# Patient Record
Sex: Male | Born: 1950
Health system: Southern US, Community
[De-identification: ages and names within clinical notes are randomized; demographics above are authoritative.]

## PROBLEM LIST (undated history)

## (undated) DIAGNOSIS — E119 Type 2 diabetes mellitus without complications: Secondary | ICD-10-CM

---

## 2007-05-17 ENCOUNTER — Ambulatory Visit: Payer: Self-pay | Admitting: Internal Medicine

## 2007-05-19 ENCOUNTER — Ambulatory Visit: Payer: Self-pay | Admitting: Internal Medicine

## 2007-05-22 ENCOUNTER — Ambulatory Visit: Payer: Self-pay | Admitting: Internal Medicine

## 2008-05-31 DIAGNOSIS — E7800 Pure hypercholesterolemia, unspecified: Secondary | ICD-10-CM | POA: Insufficient documentation

## 2008-05-31 DIAGNOSIS — E119 Type 2 diabetes mellitus without complications: Secondary | ICD-10-CM | POA: Insufficient documentation

## 2008-05-31 DIAGNOSIS — I10 Essential (primary) hypertension: Secondary | ICD-10-CM | POA: Insufficient documentation

## 2009-07-11 DIAGNOSIS — B360 Pityriasis versicolor: Secondary | ICD-10-CM | POA: Insufficient documentation

## 2009-11-08 ENCOUNTER — Emergency Department: Payer: Self-pay | Admitting: Emergency Medicine

## 2010-04-29 DIAGNOSIS — L280 Lichen simplex chronicus: Secondary | ICD-10-CM | POA: Insufficient documentation

## 2010-06-07 DIAGNOSIS — F32A Depression, unspecified: Secondary | ICD-10-CM | POA: Insufficient documentation

## 2010-06-07 DIAGNOSIS — F329 Major depressive disorder, single episode, unspecified: Secondary | ICD-10-CM | POA: Insufficient documentation

## 2010-06-07 DIAGNOSIS — M545 Low back pain: Secondary | ICD-10-CM | POA: Insufficient documentation

## 2010-06-19 DIAGNOSIS — L723 Sebaceous cyst: Secondary | ICD-10-CM | POA: Insufficient documentation

## 2012-08-24 DIAGNOSIS — I2510 Atherosclerotic heart disease of native coronary artery without angina pectoris: Secondary | ICD-10-CM | POA: Insufficient documentation

## 2013-03-23 ENCOUNTER — Emergency Department: Payer: Self-pay | Admitting: Emergency Medicine

## 2013-06-04 DIAGNOSIS — M720 Palmar fascial fibromatosis [Dupuytren]: Secondary | ICD-10-CM | POA: Insufficient documentation

## 2015-01-02 ENCOUNTER — Emergency Department
Admission: EM | Admit: 2015-01-02 | Discharge: 2015-01-02 | Disposition: A | Discharge status: Home / Self Care | Payer: Medicare Other | Attending: Emergency Medicine | Admitting: Emergency Medicine

## 2015-01-02 ENCOUNTER — Encounter: Payer: Self-pay | Admitting: *Deleted

## 2015-01-02 DIAGNOSIS — E1165 Type 2 diabetes mellitus with hyperglycemia: Secondary | ICD-10-CM | POA: Diagnosis not present

## 2015-01-02 DIAGNOSIS — K047 Periapical abscess without sinus: Secondary | ICD-10-CM | POA: Diagnosis not present

## 2015-01-02 DIAGNOSIS — R51 Headache: Secondary | ICD-10-CM | POA: Diagnosis present

## 2015-01-02 DIAGNOSIS — I10 Essential (primary) hypertension: Secondary | ICD-10-CM | POA: Insufficient documentation

## 2015-01-02 LAB — BASIC METABOLIC PANEL
ANION GAP: 12 (ref 5–15)
BUN: 12 mg/dL (ref 6–20)
CALCIUM: 9 mg/dL (ref 8.9–10.3)
CO2: 22 mmol/L (ref 22–32)
Chloride: 98 mmol/L — ABNORMAL LOW (ref 101–111)
Creatinine, Ser: 0.92 mg/dL (ref 0.61–1.24)
GLUCOSE: 510 mg/dL — AB (ref 65–99)
POTASSIUM: 4 mmol/L (ref 3.5–5.1)
SODIUM: 132 mmol/L — AB (ref 135–145)

## 2015-01-02 LAB — BLOOD GAS, VENOUS
Acid-base deficit: 2.5 mmol/L — ABNORMAL HIGH (ref 0.0–2.0)
BICARBONATE: 21.7 meq/L (ref 21.0–28.0)
FIO2: 21
PCO2 VEN: 35 mmHg — AB (ref 44.0–60.0)
PH VEN: 7.4 (ref 7.320–7.430)
Patient temperature: 37

## 2015-01-02 LAB — GLUCOSE, CAPILLARY
GLUCOSE-CAPILLARY: 307 mg/dL — AB (ref 65–99)
Glucose-Capillary: 491 mg/dL — ABNORMAL HIGH (ref 65–99)

## 2015-01-02 MED ORDER — METFORMIN HCL 500 MG PO TABS
1000.0000 mg | ORAL_TABLET | Freq: Once | ORAL | Status: AC
  Administered 2015-01-02: 1000 mg via ORAL
  Filled 2015-01-02 (×2): qty 2

## 2015-01-02 MED ORDER — PENICILLIN V POTASSIUM 500 MG PO TABS: 500.0000 mg | ORAL_TABLET | Freq: Four times a day (QID) | ORAL | Status: DC

## 2015-01-02 MED ORDER — HYDROCODONE-ACETAMINOPHEN 5-325 MG PO TABS: 1.0000 | ORAL_TABLET | Freq: Four times a day (QID) | ORAL | Status: DC | PRN

## 2015-01-02 MED ORDER — CLINDAMYCIN HCL 300 MG PO CAPS: 300.0000 mg | ORAL_CAPSULE | Freq: Three times a day (TID) | ORAL | Status: DC

## 2015-01-02 MED ORDER — METFORMIN HCL 1000 MG PO TABS
1000.0000 mg | ORAL_TABLET | Freq: Two times a day (BID) | ORAL | Status: AC
Start: 2015-01-02 — End: 2018-05-24

## 2015-01-02 MED ORDER — PENICILLIN V POTASSIUM 500 MG PO TABS
500.0000 mg | ORAL_TABLET | Freq: Once | ORAL | Status: AC
  Administered 2015-01-02: 500 mg via ORAL

## 2015-01-02 MED ORDER — INSULIN ASPART 100 UNIT/ML ~~LOC~~ SOLN
4.0000 [IU] | Freq: Once | SUBCUTANEOUS | Status: AC
  Administered 2015-01-02: 4 [IU] via INTRAVENOUS
  Filled 2015-01-02: qty 1
  Filled 2015-01-02: qty 0.04

## 2015-01-02 MED ORDER — HYDROCODONE-ACETAMINOPHEN 5-325 MG PO TABS
1.0000 | ORAL_TABLET | Freq: Once | ORAL | Status: AC
  Administered 2015-01-02: 1 via ORAL
  Filled 2015-01-02: qty 1

## 2015-01-02 MED ORDER — SODIUM CHLORIDE 0.9 % IV BOLUS (SEPSIS)
1000.0000 mL | Freq: Once | INTRAVENOUS | Status: AC
  Administered 2015-01-02: 1000 mL via INTRAVENOUS

## 2015-01-02 MED ORDER — PENICILLIN V POTASSIUM 500 MG PO TABS
ORAL_TABLET | ORAL | Status: AC
  Administered 2015-01-02: 500 mg via ORAL
  Filled 2015-01-02: qty 1

## 2015-01-02 NOTE — Discharge Instructions (Signed)
As we discussed, and is very important that she follow up with a dentist as well as a primary care provider. Your diabetes has been out of control and needs regular and routine treatment. In addition, if you have severe worsening of her headache, develop a fever, develop numbness or weakness, difficulty walking, trouble speaking, or other new concerns arise return to emergency room right away.    You have been seen in the Emergency Department (ED) today for dental pain.  Please take your prescribed antibiotic.  You may take pain medication as needed but ONLY as prescribed.  You should also take over-the-counter pain medication such as ibuprofen according to the label instructions unless a doctor has previously told you to avoid this type of medication (due to stomach ulcers, for example).  Please see you dentist as soon as possible; only a dentist will be able to fix your problem(s).  Please see below for dental follow up options.  Return to the ED if you develop worsening pain, fever, pus/drainage, difficulty breathing, or other symptoms that concern you.  OPTIONS FOR DENTAL FOLLOW UP CARE  [place] [facility] - Local Safety Net Dental Clinics [email or web address]   [facility] [phone])  [facility] [phone])  [place] [phone])  [facility] [phone])  [facility] [phone]) This clinic caters to the indigent population and is on a lottery system. Location: [employer] [facility], [employer], [address], [place] Clinic Hours: Wednesdays from 6pm - 9pm, patients seen by a lottery system. For dates, call or go to [email or web address] Services: Cleanings, fillings and simple extractions. Payment Options: DENTAL WORK IS FREE OF CHARGE. Bring proof of income or support. Best way to get  seen: Arrive at 5:15 pm - this is a lottery, NOT first come/first serve, so arriving earlier will not increase your chances of being seen.     [facility] [phone] Select option 1 for emergencies   Location: [facility], [place], [address] [place] Clinic Hours: No walk-ins accepted - call the day before to schedule an appointment. Check in times are 9:30 am and 1:30 pm. Services: Simple extractions, temporary fillings, pulpectomy/pulp debridement, uncomplicated abscess drainage. Payment Options: PAYMENT IS DUE AT THE TIME OF SERVICE.  Fee is usually $100-200, additional surgical procedures (e.g. abscess drainage) may be extra. Cash, checks, Visa/MasterCard accepted.  Can file Medicaid if patient is covered for dental - patient should call case worker to check. No discount for [facility] patients. Best way to get seen: MUST call the day before and get onto the schedule. Can usually be seen the next 1-2 days. No walk-ins accepted.     [facility] [phone]   Location: [facility], [address] [place] Clinic Hours: M, W, Th, F 8am or 1:30pm, Tues 9a or 1:30 - first come/first served. Services: Simple extractions, temporary fillings, uncomplicated abscess drainage.  You do not need to be an [facility] resident. Payment Options: PAYMENT IS DUE AT THE TIME OF SERVICE. Dental insurance, otherwise sliding scale - bring proof of income or support. Depending on income and treatment needed, cost is usually $50-200. Best way to get seen: Arrive early as it is first come/first served.     [facility] [phone]   Location: [address] Clinic Hours: Mon-Thu 8a-5p Services: Most basic dental services including extractions and  fillings. Payment Options: PAYMENT IS DUE AT THE TIME OF SERVICE. Sliding scale, up to 50% off -  bring proof if income or support. Medicaid with dental option accepted. Best way to get seen: Call to schedule an appointment, can usually be seen within 2 weeks OR they will try to see walk-ins - show up at 8a or 2p (you may have to wait).     [facility] [phone] [place] RESIDENTS ONLY   Location: [facility], [address], [place], [place] [id number] Clinic Hours: By appointment only. Monday - Thursday 8am-5pm, Friday 8am-12pm Services: Cleanings, fillings, extractions. Payment Options: PAYMENT IS DUE AT THE TIME OF SERVICE. Cash, Visa or MasterCard. Sliding scale - $30 minimum per service. Best way to get seen: Come in to office, complete packet and make an appointment - need proof of income or support monies for each household member and proof of [place] residence. Usually takes about a month to get in.     [facility] [phone]   Location: [address] [facility] Hours: Walk-in Urgent Care Dental Services are offered Monday-Friday mornings only. The numbers of emergencies accepted daily is limited to the number of providers available. Maximum 15 - Mondays, Wednesdays & Thursdays Maximum 10 - Tuesdays & Fridays Services: You do not need to be a [place] resident to be seen for a dental emergency. Emergencies are defined as pain, swelling, abnormal bleeding, or dental trauma. Walkins will receive x-rays if needed. NOTE: Dental cleaning is not an emergency. Payment Options: PAYMENT IS DUE AT THE TIME OF SERVICE. Minimum co-pay is $40.00 for uninsured patients. Minimum co-pay is $3.00 for Medicaid with dental coverage. Dental Insurance is accepted and must be presented at time of visit. Medicare does not cover dental. Forms of payment: Cash, credit card, checks. Best way to get seen: If not previously registered with the clinic, walk-in dental registration begins at 7:15  am and is on a first come/first serve basis. If previously registered with the clinic, call to make an appointment.     [facility] [phone] [place] RESIDENTS ONLY   Location: [address], [place], [place] Clinic Hours: Mon-Thu 10a-2p Services: Extractions only! Payment Options: FREE (donations accepted) - bring proof of income or support Best way to get seen: Call and schedule an appointment OR come at 8am on the 1st Monday of every month (except for holidays) when it is first come/first served.     [facility] [phone]   Location: [address], [place] Clinic Hours: Friday mornings Services, Payment Options, Best way to get seen: Call for info      Dental Abscess A dental abscess is a collection of pus in or around a tooth. CAUSES This condition is caused by a bacterial infection around the root of the tooth that involves the inner part of the tooth (pulp). It may result from:  Severe tooth decay.  Trauma to the tooth that allows bacteria to enter into the pulp, such as a broken or chipped tooth.  Severe gum disease around a tooth. SYMPTOMS Symptoms of this condition include:  Severe pain in and around the infected tooth.  Swelling and redness around the infected tooth, in the mouth, or in the face.  Tenderness.  Pus drainage.  Bad breath.  Bitter taste in the mouth.  Difficulty swallowing.  Difficulty opening the mouth.  Nausea.  Vomiting.  Chills.  Swollen neck glands.  Fever.  DIAGNOSIS This condition is diagnosed with examination of the infected tooth. During the exam, your dentist may tap on the infected tooth. Your dentist will also ask about your medical and dental history and may order X-rays. TREATMENT This condition is treated by eliminating the infection. This may be done with:  Antibiotic medicine.  A root canal. This may be performed to save the tooth.  Pulling (extracting) the tooth. This  may also involve draining the abscess. This is done if the tooth cannot be saved. HOME CARE INSTRUCTIONS  Take medicines only as directed by your dentist.  If you were prescribed antibiotic medicine, finish all of it even if you start to feel better.  Rinse your mouth (gargle) often with salt water to relieve pain or swelling.  Do not drive or operate heavy machinery while taking pain medicine.  Do not apply heat to the outside of your mouth.  Keep all follow-up visits as directed by your dentist. This is important. SEEK MEDICAL CARE IF:  Your pain is worse and is not helped by medicine. SEEK IMMEDIATE MEDICAL CARE IF:  You have a fever or chills.  Your symptoms suddenly get worse.  You have a very bad headache.  You have problems breathing or swallowing.  You have trouble opening your mouth.  You have swelling in your neck or around your eye.   This information is not intended to replace advice given to you by your health care provider. Make sure you discuss any questions you have with your health care provider.   Document Released: 02/22/2005 Document Revised: 07/09/2014 Document Reviewed: 02/19/2014 Elsevier Interactive Patient Education 2016 Elsevier Inc.  Type 2 Diabetes Mellitus, Adult Type 2 diabetes mellitus, often simply referred to as type 2 diabetes, is a long-lasting (chronic) disease. In type 2 diabetes, the pancreas does not make enough insulin (a hormone), the cells are less responsive to the insulin that is made (insulin resistance), or both. Normally, insulin moves sugars from food into the tissue cells. The tissue cells use the sugars for energy. The lack of insulin or the lack of normal response to insulin causes excess sugars to build up in the blood instead of going into the tissue cells. As a result, high blood sugar (hyperglycemia) develops. The effect of high sugar (glucose) levels can cause many complications. Type 2 diabetes was also previously called  adult-onset diabetes, but it can occur at any age.  RISK FACTORS  A person is predisposed to developing type 2 diabetes if someone in the family has the disease and also has one or more of the following primary risk factors:  Weight gain, or being overweight or obese.  An inactive lifestyle.  A history of consistently eating high-calorie foods. Maintaining a normal weight and regular physical activity can reduce the chance of developing type 2 diabetes. SYMPTOMS  A person with type 2 diabetes may not show symptoms initially. The symptoms of type 2 diabetes appear slowly. The symptoms include:  Increased thirst (polydipsia).  Increased urination (polyuria).  Increased urination during the night (nocturia).  Sudden or unexplained weight changes.  Frequent, recurring infections.  Tiredness (fatigue).  Weakness.  Vision changes, such as blurred vision.  Fruity smell to your breath.  Abdominal pain.  Nausea or vomiting.  Cuts or bruises which are slow to heal.  Tingling or numbness in the hands or feet.  An open skin wound (ulcer). DIAGNOSIS Type 2 diabetes is frequently not diagnosed until complications of diabetes are present. Type 2 diabetes  is diagnosed when symptoms or complications are present and when blood glucose levels are increased. Your blood glucose level may be checked by one or more of the following blood tests:  A fasting blood glucose test. You will not be allowed to eat for at least 8 hours before a blood sample is taken.  A random blood glucose test. Your blood glucose is checked at any time of the day regardless of when you ate.  A hemoglobin A1c blood glucose test. A hemoglobin A1c test provides information about blood glucose control over the previous 3 months.  An oral glucose tolerance test (OGTT). Your blood glucose is measured after you have not eaten (fasted) for 2 hours and then after you drink a glucose-containing beverage. TREATMENT   You  may need to take insulin or diabetes medicine daily to keep blood glucose levels in the desired range.  If you use insulin, you may need to adjust the dosage depending on the carbohydrates that you eat with each meal or snack.  Lifestyle changes are recommended as part of your treatment. These may include:  Following an individualized diet plan developed by a nutritionist or dietitian.  Exercising daily. Your health care providers will set individualized treatment goals for you based on your age, your medicines, how long you have had diabetes, and any other medical conditions you have. Generally, the goal of treatment is to maintain the following blood glucose levels:  Before meals (preprandial): 80-130 mg/dL.  After meals (postprandial): below 180 mg/dL.  A1c: less than 6.5-7%. HOME CARE INSTRUCTIONS   Have your hemoglobin A1c level checked twice a year.  Perform daily blood glucose monitoring as directed by your health care provider.  Monitor urine ketones when you are ill and as directed by your health care provider.  Take your diabetes medicine or insulin as directed by your health care provider to maintain your blood glucose levels in the desired range.  Never run out of diabetes medicine or insulin. It is needed every day.  If you are using insulin, you may need to adjust the amount of insulin given based on your intake of carbohydrates. Carbohydrates can raise blood glucose levels but need to be included in your diet. Carbohydrates provide vitamins, minerals, and fiber which are an essential part of a healthy diet. Carbohydrates are found in fruits, vegetables, whole grains, dairy products, legumes, and foods containing added sugars.  Eat healthy foods. You should make an appointment to see a registered dietitian to help you create an eating plan that is right for you.  Lose weight if you are overweight.  Carry a medical alert card or wear your medical alert jewelry.  Carry  a 15-gram carbohydrate snack with you at all times to treat low blood glucose (hypoglycemia). Some examples of 15-gram carbohydrate snacks include:  Glucose tablets, 3 or 4.  Glucose gel, 15-gram tube.  Raisins, 2 tablespoons (24 grams).  Jelly beans, 6.  Animal crackers, 8.  Regular pop, 4 ounces (120 mL).  Gummy treats, 9.  Recognize hypoglycemia. Hypoglycemia occurs with blood glucose levels of 70 mg/dL and below. The risk for hypoglycemia increases when fasting or skipping meals, during or after intense exercise, and during sleep. Hypoglycemia symptoms can include:  Tremors or shakes.  Decreased ability to concentrate.  Sweating.  Increased heart rate.  Headache.  Dry mouth.  Hunger.  Irritability.  Anxiety.  Restless sleep.  Altered speech or coordination.  Confusion.  Treat hypoglycemia promptly. If you are alert and able  to safely swallow, follow the 15:15 rule:  Take 15-20 grams of rapid-acting glucose or carbohydrate. Rapid-acting options include glucose gel, glucose tablets, or 4 ounces (120 mL) of fruit juice, regular soda, or low-fat milk.  Check your blood glucose level 15 minutes after taking the glucose.  Take 15-20 grams more of glucose if the repeat blood glucose level is still 70 mg/dL or below.  Eat a meal or snack within 1 hour once blood glucose levels return to normal.  Be alert to feeling very thirsty and urinating more frequently than usual, which are early signs of hyperglycemia. An early awareness of hyperglycemia allows for prompt treatment. Treat hyperglycemia as directed by your health care provider.  Engage in at least 150 minutes of moderate-intensity physical activity a week, spread over at least 3 days of the week or as directed by your health care provider. In addition, you should engage in resistance exercise at least 2 times a week or as directed by your health care provider. Try to spend no more than 90 minutes at one time  inactive.  Adjust your medicine and food intake as needed if you start a new exercise or sport.  Follow your sick-day plan anytime you are unable to eat or drink as usual.  Do not use any tobacco products including cigarettes, chewing tobacco, or electronic cigarettes. If you need help quitting, ask your health care provider.  Limit alcohol intake to no more than 1 drink per day for nonpregnant women and 2 drinks per day for men. You should drink alcohol only when you are also eating food. Talk with your health care provider whether alcohol is safe for you. Tell your health care provider if you drink alcohol several times a week.  Keep all follow-up visits as directed by your health care provider. This is important.  Schedule an eye exam soon after the diagnosis of type 2 diabetes and then annually.  Perform daily skin and foot care. Examine your skin and feet daily for cuts, bruises, redness, nail problems, bleeding, blisters, or sores. A foot exam by a health care provider should be done annually.  Brush your teeth and gums at least twice a day and floss at least once a day. Follow up with your dentist regularly.  Share your diabetes management plan with your workplace or school.  Keep your immunizations up to date. It is recommended that you receive a flu (influenza) vaccine every year. It is also recommended that you receive a pneumonia (pneumococcal) vaccine. If you are 64 years of age or older and have never received a pneumonia vaccine, this vaccine may be given as a series of two separate shots. Ask your health care provider which additional vaccines may be recommended.  Learn to manage stress.  Obtain ongoing diabetes education and support as needed.  Participate in or seek rehabilitation as needed to maintain or improve independence and quality of life. Request a physical or occupational therapy referral if you are having foot or hand numbness, or difficulties with grooming,  dressing, eating, or physical activity. SEEK MEDICAL CARE IF:   You are unable to eat food or drink fluids for more than 6 hours.  You have nausea and vomiting for more than 6 hours.  Your blood glucose level is over 240 mg/dL.  There is a change in mental status.  You develop an additional serious illness.  You have diarrhea for more than 6 hours.  You have been sick or have had a fever for  a couple of days and are not getting better.  You have pain during any physical activity.  SEEK IMMEDIATE MEDICAL CARE IF:  You have difficulty breathing.  You have moderate to large ketone levels.   This information is not intended to replace advice given to you by your health care provider. Make sure you discuss any questions you have with your health care provider.   Document Released: 02/22/2005 Document Revised: 11/13/2014 Document Reviewed: 09/21/2011 Elsevier Interactive Patient Education [employer]

## 2015-01-02 NOTE — ED Provider Notes (Signed)
[facility] Emergency Department Provider Note REMINDER - THIS NOTE IS NOT A FINAL MEDICAL RECORD UNTIL IT IS SIGNED. UNTIL THEN, THE CONTENT BELOW MAY REFLECT INFORMATION FROM A DOCUMENTATION TEMPLATE, NOT THE ACTUAL PATIENT VISIT. ____________________________________________  Time seen: Approximately 6:10 PM  I have reviewed the triage vital signs and the nursing notes.   HISTORY  Chief Complaint Headache    HPI Shawn Wagner is a 64 y.o. male history diabetes, hypertension. Also noncompliant with medications due to expenses.  He presents today for about 1 week of pain in the right upper jaw/mouth, states that he has infected tooth and it is shooting pain and occasional tingling up into the area below the right eye from the region where he points at the right maxilla.  This is not associated with any fever, numbness, weakness. He does have a mild right-sided headache which she says is shooting up from the upper teeth. No nausea or vomiting. Does report he is supposed be on metformin, but he has been off of this due to expenses.   Past medical history Patient states he is a diabetic Also states he has high blood pressure   He denies any known allergies. No past medical history on file.  There are no active problems to display for this patient.   No past surgical history on file.  Current Outpatient Rx  Name  Route  Sig  Dispense  Refill  . clindamycin (CLEOCIN) 300 MG capsule   Oral   Take 1 capsule (300 mg total) by mouth 3 (three) times daily.   30 capsule   0   . HYDROcodone-acetaminophen (NORCO/VICODIN) 5-325 MG tablet   Oral   Take 1 tablet by mouth every 6 (six) hours as needed for moderate pain.   15 tablet   0   . metFORMIN (GLUCOPHAGE) 1000 MG tablet   Oral   Take 1 tablet (1,000 mg total) by mouth 2 (two) times daily with a meal.   60 tablet   0   . penicillin v potassium (VEETID) 500 MG tablet   Oral   Take 1 tablet (500  mg total) by mouth 4 (four) times daily.   28 tablet   0     Allergies Review of patient's allergies indicates no known allergies.  No family history on file.  Social History Social History  Substance Use Topics  . Smoking status: Never Smoker   . Smokeless tobacco: Not on file  . Alcohol Use: No    Review of Systems Constitutional: No fever/chills Eyes: No visual changes. No trouble seeing. Denies eye pain. No pain over the temples. ENT: No sore throat. They will open and close his jaw no problem. Able eat without problem. Cardiovascular: Denies chest pain. Respiratory: Denies shortness of breath. Gastrointestinal: No abdominal pain.  No nausea, no vomiting.  No diarrhea.  No constipation. Genitourinary: Negative for dysuria. Musculoskeletal: Negative for back pain. Skin: Negative for rash. Neurological: Negative for  focal weakness or numbness. No trouble speaking. No trouble walking.  10-point ROS otherwise negative.  ____________________________________________   PHYSICAL EXAM:  VITAL SIGNS: ED Triage Vitals  Enc Vitals Group     BP 01/02/15 1719 119/77 mmHg     Pulse Rate 01/02/15 1716 97     Resp 01/02/15 1716 20     Temp 01/02/15 1716 98 F (36.7 C)     Temp Source 01/02/15 1716 Oral     SpO2 01/02/15 1716 98 %  Weight 01/02/15 1716 170 lb (77.111 kg)     Height 01/02/15 1716 6\' 1"  (1.854 m)     Head Cir --      Peak Flow --      Pain Score 01/02/15 1718 8     Pain Loc --      Pain Edu? --      Excl. in [place]? --    Constitutional: Alert and oriented. Well appearing and in no acute distress. Patient is quite amicable and in no distress. Eyes: Conjunctivae are normal. PERRL. EOMI. Head: Atraumatic. No temporal artery tenderness. Normal temporal artery pulsations bilaterally. Nose: No congestion/rhinnorhea. Mouth/Throat: Mucous membranes are moist.  The patient has a right upper incisor is very tender, as an obvious cavity, and is associated with a  small draining fistula in the right lateral gumline. There is minimal edema over the right maxilla, and slight tenderness of the right maxilla. Oropharynx non-erythematous. No lingular edema. No trismus. Very clear and patent airway. Neck: No stridor.   Cardiovascular: Normal rate, regular rhythm. Grossly normal heart sounds.  Good peripheral circulation. Respiratory: Normal respiratory effort.  No retractions. Lungs CTAB. Gastrointestinal: Soft and nontender. No distention. No abdominal bruits. No CVA tenderness. Musculoskeletal: No lower extremity tenderness nor edema.  No joint effusions. Neurologic:  Normal speech and language. No gross focal neurologic deficits are appreciated. No gait instability.  The patient has no pronator drift. The patient has normal cranial nerve exam. Extraocular movements are normal. Visual fields are normal. Patient has 5 out of 5 strength in all extremities. There is no numbness or gross, acute sensory abnormality in the extremities bilaterally. No speech disturbance. No dysarthria. No aphasia. No ataxia. Patient speaking in full and clear sentences.   Skin:  Skin is warm, dry and intact. No rash noted. Psychiatric: Mood and affect are normal. Speech and behavior are normal.  ____________________________________________   LABS (all labs ordered are listed, but only abnormal results are displayed)  Labs Reviewed  BASIC METABOLIC PANEL - Abnormal; Notable for the following:    Sodium 132 (*)    Chloride 98 (*)    Glucose, Bld 510 (*)    All other components within normal limits  BLOOD GAS, VENOUS - Abnormal; Notable for the following:    pCO2, Ven 35 (*)    Acid-base deficit 2.5 (*)    All other components within normal limits  GLUCOSE, CAPILLARY - Abnormal; Notable for the following:    Glucose-Capillary 491 (*)    All other components within normal limits  GLUCOSE, CAPILLARY - Abnormal; Notable for the following:    Glucose-Capillary 307 (*)     All other components within normal limits  CBG MONITORING, ED   ____________________________________________  EKG   ____________________________________________  RADIOLOGY   ____________________________________________   PROCEDURES  Procedure(s) performed: None  Critical Care performed: No  ____________________________________________   INITIAL IMPRESSION / ASSESSMENT AND PLAN / ED COURSE  Pertinent labs & imaging results that were available during my care of the patient were reviewed by me and considered in my medical decision making (see chart for details).  She presents with a right-sided facial pain and headache. Appears be most likely secondary to an infected tooth in the right upper maxilla. We did discuss obtaining a CAT scan, the patient does not wish to have this. Please see my note below.  I will plan to treat the patient with antibiotics, I'll give him prescriptions for clindamycin and penicillin and he will take both to  the pharmacy and review cost of each, but minimal at least start the penicillin though I did discuss than the clindamycin is more efficacious. In addition, give him a brief course of pain medication. He'll follow up closely with primary care and dental.  Check patient's sugar which is greater than 400, we will obtain basic metabolic And give insulin/fluids. Anticipate discharge with metformin if no acute metabolic derangements. ____________________________________________   FINAL CLINICAL IMPRESSION(S) / ED DIAGNOSES  Final diagnoses:  Dental abscess  Type 2 diabetes mellitus with hyperglycemia, without long-term current use of insulin (HCC)        ----------------------------------------- 6:08 PM on 01/02/2015 -----------------------------------------  I discussed with the patient his symptoms, and clinically I agree likely his headache and facial pain as being referred up from the dental abscess with a noted fistula of the right  upper maxilla and some slight swelling. We did discuss the risks and benefits of CAT scan to exclude diagnosis such as bleeding, tumor, or less likely other cause like stroke. After discussion of the risks and benefits, patient voiced significant financial concerns and, though I did offer it I think it is not unreasonable to forego CAT scan with the understanding that if symptoms worsen, he develops a fever, he has a numbness tingling weakness nausea or severe worsening headache, vision changes or other concerns to come back right away for a CAT scan.  In addition, the patient is a diabetic, but has not been on his metformin or insulin for some time. I will check a glucose, if it is greater than 350 then we will consider doing blood work. The patient is quite clear that his goal today is to get his dental pain treated which seems to be causing his headache, in addition he does not wish to incur significant cost of care today. I'm willing to work with this constraints given the patient has good return precautions and is agreeable to establish primary care. I plan to place him back on his metformin 1000 twice daily which is previously been on, and have strongly encourage the need for primary care follow-up for which she is agreeable.  ----------------------------------------- 7:51 PM on 01/02/2015 -----------------------------------------  Patient reports improvement. Currently in no distress. Discussed the patient and will provide him prescriptions for 2 antibiotics, he will fill that which he prefers based on cost. I did encourage the clindamycin may have better effect, but he is extremely concerned and cost conscious. I have discussed financial counseling, but he will have to return tomorrow for this. We'll also give him referrals for local dental clinics and multiple primary care providers in the area. Patient agreeable careful return precautions and follow-up care.  Sharyn Creamer, MD 01/02/15 [phone]

## 2015-01-02 NOTE — ED Notes (Signed)
Pt has a headache for 4 days.  Taking goody powders without pain relief.  Pt alert.  Speech clear.

## 2015-01-02 NOTE — ED Notes (Signed)
Also says he has a swollen numb on his gums on the right side as well.  Has had for 2 months, but has not been to dentist.

## 2015-01-07 ENCOUNTER — Emergency Department: Payer: Medicare Other

## 2015-01-07 ENCOUNTER — Encounter: Payer: Self-pay | Admitting: Emergency Medicine

## 2015-01-07 ENCOUNTER — Emergency Department
Admission: EM | Admit: 2015-01-07 | Discharge: 2015-01-07 | Disposition: A | Discharge status: Home / Self Care | Payer: Medicare Other | Attending: Emergency Medicine | Admitting: Emergency Medicine

## 2015-01-07 DIAGNOSIS — R51 Headache: Secondary | ICD-10-CM | POA: Diagnosis present

## 2015-01-07 DIAGNOSIS — Z792 Long term (current) use of antibiotics: Secondary | ICD-10-CM | POA: Insufficient documentation

## 2015-01-07 DIAGNOSIS — Z79899 Other long term (current) drug therapy: Secondary | ICD-10-CM | POA: Insufficient documentation

## 2015-01-07 DIAGNOSIS — G930 Cerebral cysts: Secondary | ICD-10-CM | POA: Insufficient documentation

## 2015-01-07 DIAGNOSIS — K047 Periapical abscess without sinus: Secondary | ICD-10-CM | POA: Diagnosis not present

## 2015-01-07 DIAGNOSIS — E1165 Type 2 diabetes mellitus with hyperglycemia: Secondary | ICD-10-CM | POA: Diagnosis not present

## 2015-01-07 DIAGNOSIS — R739 Hyperglycemia, unspecified: Secondary | ICD-10-CM

## 2015-01-07 DIAGNOSIS — R519 Headache, unspecified: Secondary | ICD-10-CM

## 2015-01-07 HISTORY — DX: Type 2 diabetes mellitus without complications: E11.9

## 2015-01-07 LAB — BASIC METABOLIC PANEL
ANION GAP: 13 (ref 5–15)
BUN: 20 mg/dL (ref 6–20)
CHLORIDE: 93 mmol/L — AB (ref 101–111)
CO2: 23 mmol/L (ref 22–32)
CREATININE: 1.1 mg/dL (ref 0.61–1.24)
Calcium: 9.2 mg/dL (ref 8.9–10.3)
GFR calc non Af Amer: >60 mL/min (ref >60)
Glucose, Bld: 487 mg/dL — ABNORMAL HIGH (ref 65–99)
POTASSIUM: 4.1 mmol/L (ref 3.5–5.1)
SODIUM: 129 mmol/L — AB (ref 135–145)

## 2015-01-07 LAB — CBC WITH DIFFERENTIAL/PLATELET
BASOS ABS: 0.1 10*3/uL (ref 0–0.1)
EOS ABS: 0.1 10*3/uL (ref 0–0.7)
Eosinophils Relative: 2 %
HEMATOCRIT: 47.2 % (ref 40.0–52.0)
HEMOGLOBIN: 16.9 g/dL (ref 13.0–18.0)
Lymphocytes Relative: 34 %
Lymphs Abs: 2.2 10*3/uL (ref 1.0–3.6)
MCH: 31.1 pg (ref 26.0–34.0)
MCHC: 35.9 g/dL (ref 32.0–36.0)
MCV: 86.7 fL (ref 80.0–100.0)
Monocytes Absolute: 0.8 10*3/uL (ref 0.2–1.0)
NEUTROS ABS: 3.3 10*3/uL (ref 1.4–6.5)
Platelets: 219 10*3/uL (ref 150–440)
RBC: 5.44 MIL/uL (ref 4.40–5.90)
RDW: 12.3 % (ref 11.5–14.5)
WBC: 6.5 10*3/uL (ref 3.8–10.6)

## 2015-01-07 LAB — GLUCOSE, CAPILLARY
GLUCOSE-CAPILLARY: 528 mg/dL — AB (ref 65–99)
GLUCOSE-CAPILLARY: 321 mg/dL — AB (ref 65–99)

## 2015-01-07 MED ORDER — METOCLOPRAMIDE HCL 5 MG/ML IJ SOLN
10.0000 mg | Freq: Once | INTRAMUSCULAR | Status: AC
  Administered 2015-01-07: 10 mg via INTRAVENOUS
  Filled 2015-01-07: qty 2

## 2015-01-07 MED ORDER — LORAZEPAM 1 MG PO TABS
1.0000 mg | ORAL_TABLET | Freq: Once | ORAL | Status: AC
  Administered 2015-01-07: 1 mg via ORAL
  Filled 2015-01-07: qty 1

## 2015-01-07 MED ORDER — INSULIN ASPART 100 UNIT/ML ~~LOC~~ SOLN
8.0000 [IU] | Freq: Once | SUBCUTANEOUS | Status: AC
  Administered 2015-01-07: 8 [IU] via SUBCUTANEOUS
  Filled 2015-01-07: qty 8

## 2015-01-07 MED ORDER — GLYBURIDE 5 MG PO TABS: 5.0000 mg | ORAL_TABLET | Freq: Every day | ORAL | Status: DC

## 2015-01-07 MED ORDER — SODIUM CHLORIDE 0.9 % IV BOLUS (SEPSIS)
1000.0000 mL | Freq: Once | INTRAVENOUS | Status: AC
  Administered 2015-01-07: 1000 mL via INTRAVENOUS

## 2015-01-07 MED ORDER — IOHEXOL 300 MG/ML  SOLN
75.0000 mL | Freq: Once | INTRAMUSCULAR | Status: AC | PRN
  Administered 2015-01-07: 75 mL via INTRAVENOUS

## 2015-01-07 MED ORDER — ACETAMINOPHEN 500 MG PO TABS
1000.0000 mg | ORAL_TABLET | Freq: Once | ORAL | Status: AC
  Administered 2015-01-07: 1000 mg via ORAL
  Filled 2015-01-07: qty 2

## 2015-01-07 MED ORDER — METOCLOPRAMIDE HCL 5 MG PO TABS: 5.0000 mg | ORAL_TABLET | Freq: Three times a day (TID) | ORAL | Status: DC

## 2015-01-07 MED ORDER — BLOOD GLUCOSE MONITOR KIT: PACK | Status: AC

## 2015-01-07 NOTE — ED Provider Notes (Signed)
[facility] Emergency Department Provider Note  ____________________________________________  Time seen: 1825  I have reviewed the triage vital signs and the nursing notes.   HISTORY  Chief Complaint Headache and Blurred Vision     HPI Shawn Wagner is a 64 y.o. male who was seen here on October 27 for similar symptoms-A headache in the right temple. At that time, it was noted that he had a dental infection. The patient has reported dentition. He was started on antibiotics. The patient reports that the "pocket of pus" that was present in the right upper jaw has improved, but he has had ongoing pain in the right temple. He does not usually get headaches like this and it is disconcerting to him. He denies any focal tenderness over the temple. He denies any fevers, nausea, or vomiting. He has not had any weakness, but does have reduced activity due to the headache.  The patient was seen here the other day, he did have a high sugar level. He was prescribed metformin and reports he has been taking it, but he does not have a glucometer and does not have her sugars have been running. He has not arranged for a dental appointment yet.    Past Medical History  Diagnosis Date  . Diabetes mellitus without complication ([place])     There are no active problems to display for this patient.   History reviewed. No pertinent past surgical history.  Current Outpatient Rx  Name  Route  Sig  Dispense  Refill  . blood glucose meter kit and supplies KIT      Dispense based on patient and insurance preference. Use up to four times daily as directed. (FOR ICD-9 250.00, 250.01).   1 each   0   . clindamycin (CLEOCIN) 300 MG capsule   Oral   Take 1 capsule (300 mg total) by mouth 3 (three) times daily.   30 capsule   0   . glyBURIDE (DIABETA) 5 MG tablet   Oral   Take 1 tablet (5 mg total) by mouth daily with breakfast.   15 tablet   0   . HYDROcodone-acetaminophen  (NORCO/VICODIN) 5-325 MG tablet   Oral   Take 1 tablet by mouth every 6 (six) hours as needed for moderate pain.   15 tablet   0   . metFORMIN (GLUCOPHAGE) 1000 MG tablet   Oral   Take 1 tablet (1,000 mg total) by mouth 2 (two) times daily with a meal.   60 tablet   0   . metoCLOPramide (REGLAN) 5 MG tablet   Oral   Take 1 tablet (5 mg total) by mouth 3 (three) times daily.   15 tablet   0   . penicillin v potassium (VEETID) 500 MG tablet   Oral   Take 1 tablet (500 mg total) by mouth 4 (four) times daily.   28 tablet   0     Allergies Vicodin  No family history on file.  Social History Social History  Substance Use Topics  . Smoking status: Never Smoker   . Smokeless tobacco: None  . Alcohol Use: No    Review of Systems  Constitutional: Negative for fever. ENT: Headache, right temple, with right upper jaw infection. She history of present illness. Cardiovascular: Negative for chest pain. Respiratory: Negative for cough. Gastrointestinal: Negative for abdominal pain, vomiting and diarrhea. Genitourinary: Negative for dysuria. Musculoskeletal: No myalgias or injuries. Skin: Negative for rash. Neurological: Positive for headache. Negative  for focal weakness.   10-point ROS otherwise negative.  ____________________________________________   PHYSICAL EXAM:  VITAL SIGNS: ED Triage Vitals  Enc Vitals Group     BP 01/07/15 1802 99/75 mmHg     Pulse Rate 01/07/15 1802 105     Resp 01/07/15 1802 16     Temp 01/07/15 1802 98.1 F (36.7 C)     Temp Source 01/07/15 1802 Oral     SpO2 01/07/15 1802 96 %     Weight 01/07/15 1802 170 lb (77.111 kg)     Height 01/07/15 1802 '6\' 1"'  (1.854 m)     Head Cir --      Peak Flow --      Pain Score 01/07/15 1803 10     Pain Loc --      Pain Edu? --      Excl. in [place]? --     Constitutional:  Alert and oriented. Appears uncomfortable but in no acute distress. ENT   Head: Normocephalic and atraumatic.   Nose:  No congestion/rhinnorhea.       Mouth: Poor dentition throughout with multiple teeth with extensive decay. There is a small red bump above tooth number 5 which is the remainder of the small abscess hemostat had before. There is no notable abscess currently.   Cardiovascular: Normal rate, regular rhythm, no murmur noted Respiratory:  Normal respiratory effort, no tachypnea.    Breath sounds are clear and equal bilaterally.  Gastrointestinal: Soft and nontender. No distention.  Musculoskeletal: No deformity noted. Nontender with normal range of motion in all extremities.  No noted edema. Neurologic:  Normal speech and language. No gross focal neurologic deficits are appreciated.  Skin:  Skin is warm, dry. No rash noted. Psychiatric: Mood and affect are normal. Speech and behavior are normal.  ____________________________________________    LABS (pertinent positives/negatives)  Labs Reviewed  BASIC METABOLIC PANEL - Abnormal; Notable for the following:    Sodium 129 (*)    Chloride 93 (*)    Glucose, Bld 487 (*)    All other components within normal limits  GLUCOSE, CAPILLARY - Abnormal; Notable for the following:    Glucose-Capillary 528 (*)    All other components within normal limits  GLUCOSE, CAPILLARY - Abnormal; Notable for the following:    Glucose-Capillary 321 (*)    All other components within normal limits  CBC WITH DIFFERENTIAL/PLATELET     ____________________________________________   EKG  ED ECG REPORT I, Lizzie Cokley W, the attending physician, personally viewed and interpreted this ECG.   Date: 01/07/2015  EKG Time: 1808  Rate: 106  Rhythm: Sinus tachycardia with first-degree block  Axis: Left axis -81  Intervals: PR 216  ST&T Change: None noted Abnormal EKG  ____________________________________________    RADIOLOGY  CT, maxillofacial:  IMPRESSION: Sizable right anterior temporal arachnoid cyst. No enhancing lesion or abscess is appreciable on  this examination. Note that susceptibility artifact from dental amalgam makes evaluation of the dental regions less than optimal by CT.  Paranasal sinuses are clear. Ostiomeatal unit complexes are patent bilaterally. No fracture or dislocation. No erosive change or bony destruction. Mastoids bilaterally are clear.   ____________________________________________   ____________________________________________   INITIAL IMPRESSION / ASSESSMENT AND PLAN / ED COURSE  Pertinent labs & imaging results that were available during my care of the patient were reviewed by me and considered in my medical decision making (see chart for details).  64 year old male with pain in the right temple. This been present for 5  or 6 days. I suspect it is due to the dental infection that he has. We will perform a CT maxillofacial to look for deeper abscess.  Patient does have a history of diabetes and was hyperglycemic when he was last seen here. We will check a blood sugar level and give him some IV fluids. Given his history and his current treatment, it won't surprise [place] to see an elevated sugar level.  ----------------------------------------- 8:30 PM on 01/07/2015 -----------------------------------------  White blood cell count is normal at 6.5. Glucose is elevated-initially 528 on fingerstick, and 487 with lab measurement.  He is been treated with 1 L of normal saline, Reglan for the headache, and I have added 8 units of insulin subcutaneous due to the elevated glucose level. We'll check a repeat CBG at this time.   ----------------------------------------- 8:47 PM on 01/07/2015 -----------------------------------------  On reexam, the patient was sleeping peacefully. He woke up appropriately. We checked his blood sugar, which was 312. He reports his headache is improved. He looks more comfortable. He is in no acute distress.  I had a detailed conversation about the finding of the arachnoid cyst on  the CT scan. The patient reports he had been told he had this before.  The patient will continue on antibiotics for the dental infection which appears improved.  One of his main issues now is the ongoing hyperglycemia. The patient and his wife now tell me that he had been on insulin but he cannot afford this.  I have asked him to purchase a glucometer and we will place him on glyburide, 5 mg.  With the patient having received IV contrast, I counseled him to not take his metformin tomorrow.  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 I counseled him and his wife that he should follow up with them and seek an evaluation by a neurosurgeon at [facility] as well.  ____________________________________________   FINAL CLINICAL IMPRESSION(S) / ED DIAGNOSES  Final diagnoses:  Acute intractable headache, unspecified headache type  Arachnoid cyst  Hyperglycemia  Chronic dental infection      Ahmed Prima, MD 01/07/15 2057

## 2015-01-07 NOTE — ED Notes (Signed)
Pt reports right sided headache x1 week; reports continued blurred vision for the past week too. Was seen here on 10/27 and tx for dental abscess.

## 2015-01-07 NOTE — Discharge Instructions (Signed)
Your CT showed the arachnoid cyst that you knew about previously.  [facility] with [facility] and discussed with them [facility]. Follow-up with the dentist for your poor dentition and recent dental abscess. Because of the IV contrast that you received for the CT scan, do not take metformin tomorrow. Purchased a glucometer if you're able and monitor your blood sugars. We are prescribing glyburide, 5 mg. Take this once per day. This may need to be changed to twice a day or your doctor may push on a different regimen. We are also prescribing Reglan, which is the medicine we gave you for the headache. He may take this and take Tylenol. That worked well fever in the emergency department. Return to the emergency department if you have worsening pain, fever, uncontrolled headache, or other urgent concerns.  Hyperglycemia Hyperglycemia occurs when the glucose (sugar) in your blood is too high. Hyperglycemia can happen for many reasons, but it most often happens to people who do not know they have diabetes or are not managing their diabetes properly.  CAUSES  Whether you have diabetes or not, there are other causes of hyperglycemia. Hyperglycemia can occur when you have diabetes, but it can also occur in other situations that you might not be as aware of, such as: Diabetes  If you have diabetes and are having problems controlling your blood glucose, hyperglycemia could occur because of some of the following reasons:  Not following your meal plan.  Not taking your diabetes medications or not taking it properly.  Exercising less or doing less activity than you normally do.  Being sick. Pre-diabetes  This cannot be ignored. Before people develop Type 2 diabetes, they almost always have "pre-diabetes." This is when your blood glucose levels are higher than normal, but not yet high enough to be diagnosed as diabetes. Research has shown that some long-term damage to the body,  especially the heart and circulatory system, may already be occurring during pre-diabetes. If you take action to manage your blood glucose when you have pre-diabetes, you may delay or prevent Type 2 diabetes from developing. Stress  If you have diabetes, you may be "diet" controlled or on oral medications or insulin to control your diabetes. However, you may find that your blood glucose is higher than usual in the hospital whether you have diabetes or not. This is often referred to as "stress hyperglycemia." Stress can elevate your blood glucose. This happens because of hormones put out by the body during times of stress. If stress has been the cause of your high blood glucose, it can be followed regularly by your caregiver. That way he/she can make sure your hyperglycemia does not continue to get worse or progress to diabetes. Steroids  Steroids are medications that act on the infection fighting system (immune system) to block inflammation or infection. One side effect can be a rise in blood glucose. Most people can produce enough extra insulin to allow for this rise, but for those who cannot, steroids make blood glucose levels go even higher. It is not unusual for steroid treatments to "uncover" diabetes that is developing. It is not always possible to determine if the hyperglycemia will go away after the steroids are stopped. A special blood test called an A1c is sometimes done to determine if your blood glucose was elevated before the steroids were started. SYMPTOMS  Thirsty.  Frequent urination.  Dry mouth.  Blurred vision.  Tired or fatigue.  Weakness.  Sleepy.  Tingling in feet or leg. DIAGNOSIS  Diagnosis is made by monitoring blood glucose in one or all of the following ways:  A1c test. This is a chemical found in your blood.  Fingerstick blood glucose monitoring.  Laboratory results. TREATMENT  First, knowing the cause of the hyperglycemia is important before the  hyperglycemia can be treated. Treatment may include, but is not be limited to:  Education.  Change or adjustment in medications.  Change or adjustment in meal plan.  Treatment for an illness, infection, etc.  More frequent blood glucose monitoring.  Change in exercise plan.  Decreasing or stopping steroids.  Lifestyle changes. HOME CARE INSTRUCTIONS   Test your blood glucose as directed.  Exercise regularly. Your caregiver will give you instructions about exercise. Pre-diabetes or diabetes which comes on with stress is helped by exercising.  Eat wholesome, balanced meals. Eat often and at regular, fixed times. Your caregiver or nutritionist will give you a meal plan to guide your sugar intake.  Being at an ideal weight is important. If needed, losing as little as 10 to 15 pounds may help improve blood glucose levels. SEEK MEDICAL CARE IF:   You have questions about medicine, activity, or diet.  You continue to have symptoms (problems such as increased thirst, urination, or weight gain). SEEK IMMEDIATE MEDICAL CARE IF:   You are vomiting or have diarrhea.  Your breath smells fruity.  You are breathing faster or slower.  You are very sleepy or incoherent.  You have numbness, tingling, or pain in your feet or hands.  You have chest pain.  Your symptoms get worse even though you have been following your caregiver's orders.  If you have any other questions or concerns.   This information is not intended to replace advice given to you by your health care provider. Make sure you discuss any questions you have with your health care provider.   Document Released: 08/18/2000 Document Revised: 05/17/2011 Document Reviewed: 10/29/2014 Elsevier Interactive Patient Education 2016 [employer]  Arachnoid Cysts An arachnoid cyst is a fluid-filled sac in the space between your brain or spinal cord and one of the membranes that covers the brain or spinal cord (arachnoid  membrane). The cyst is filled with clear fluid (cerebrospinal fluid) that protects and nourishes your brain and spinal cord. Arachnoid cysts are not cancerous. Arachnoid cysts usually develop in the middle part of your brain. They can also develop near your spinal cord. If a cyst is small and not causing symptoms, you may not need treatment. Your health care provider may check the cyst regularly to see if it grows over time. If your cyst is large enough to cause symptoms, you may need a surgical procedure to drain or remove it. CAUSES  The most common cause of an arachnoid cyst is a defect that develops before birth. This is called a primary arachnoid cyst. A secondary arachnoid cyst is less common and may be caused by:  Head injury.  Brain infection.  A tumor that develops after brain surgery. RISK FACTORS  You may be at risk for a primary arachnoid cyst if you:   Have a family history of brain cysts.  Have inherited genes that increase your risk.  Are male. You may be at risk for a secondary arachnoid cyst if you have had:  A brain injury.  A brain infection.  A brain tumor.  Brain surgery. SIGNS AND SYMPTOMS  It is possible to have an arachnoid cyst without any symptoms. If  you do have symptoms, the type of symptoms will depend on the size and location of your cyst. Signs and symptoms of an arachnoid cyst near your brain may include:  Nausea and vomiting.  Headache.  Seizures.  Dizziness and loss of balance.  Problems with hearing or vision.  Clumsiness.  Weakness. Signs and symptoms of an arachnoid cyst near your spinal cord may include:  Leg weakness and tingling.  Arm weakness and tingling.  Curved spine.  Back pain.  Arm or leg pain.  Jerky movements of the arms or legs (spasticity). DIAGNOSIS  Your health care provider may suspect an arachnoid cyst from your signs and symptoms. You will need to have tests done to confirm the diagnosis. These may  include:  CT scan.  MRI. TREATMENT  Surgery is the only treatment for an arachnoid cyst that causes symptoms. This may involve brain or spinal cord surgery to do one of the following:  Open and drain the cyst.  Drain the cyst by placing a tube (shunt) into the cyst.  Remove the cyst completely.   This information is not intended to replace advice given to you by your health care provider. Make sure you discuss any questions you have with your health care provider.   Document Released: 02/12/2002 Document Revised: 03/15/2014 Document Reviewed: 04/12/2013 Elsevier Interactive Patient Education [employer]

## 2016-01-21 DIAGNOSIS — G930 Cerebral cysts: Secondary | ICD-10-CM | POA: Insufficient documentation

## 2016-01-21 DIAGNOSIS — R911 Solitary pulmonary nodule: Secondary | ICD-10-CM | POA: Insufficient documentation

## 2016-02-04 ENCOUNTER — Ambulatory Visit: Payer: Self-pay | Admitting: Podiatry

## 2016-02-11 ENCOUNTER — Ambulatory Visit (INDEPENDENT_AMBULATORY_CARE_PROVIDER_SITE_OTHER): Payer: Medicare Other | Admitting: Podiatry

## 2016-02-11 ENCOUNTER — Ambulatory Visit: Payer: Medicare Other

## 2016-02-11 ENCOUNTER — Encounter: Payer: Self-pay | Admitting: Podiatry

## 2016-02-11 VITALS — BP 134/75 | HR 90 | Temp 98.2°F | Resp 16

## 2016-02-11 DIAGNOSIS — M79676 Pain in unspecified toe(s): Secondary | ICD-10-CM

## 2016-02-11 DIAGNOSIS — B351 Tinea unguium: Secondary | ICD-10-CM | POA: Diagnosis not present

## 2016-02-11 DIAGNOSIS — M722 Plantar fascial fibromatosis: Secondary | ICD-10-CM

## 2016-02-11 DIAGNOSIS — R52 Pain, unspecified: Secondary | ICD-10-CM

## 2016-02-11 NOTE — Progress Notes (Signed)
   Subjective:    Patient ID: Shawn Wagner, male    DOB: [birth year], 65 y.o.   MRN: [id number]  HPI: He presents today with his wife with a chief complaint of a painful foot left. He states that he has a history of idiopathic neuropathy extending from the tips of the toes to the mid foot. He states that I have not some bottom of my left foot that are painful.    Review of Systems  All other systems reviewed and are negative.      Objective:   Physical Exam: Vital signs are stable he is alert and oriented 3. Pulses are palpable. Neurologic sensorium is diminished per Semmes-Weinstein monofilament to the toes reconstitutes in the midfoot. Deep tendon reflexes are intact muscle strength appears to be normal bilateral. Orthopedic evaluation does demonstrate what appears to be plantar fibromas chain of 3 small 1 cm fibromas along the medial aspect of the left plantar fascia. No palpable fibromas to the right foot. Radiographs do not demonstrate significant osteoarthritic changes or major osseous changes in general. No fractures are identified. He also has Dupuytren's contractures. His toenails are long thick yellow dystrophic and clinically mycotic.        Assessment & Plan:  Plantar fibromatosis and idiopathic peripheral neuropathy. Probable diabetic polyneuropathy.   Plan: I injected the 3 lesions plantar aspect of the left foot today. I will follow-up with him in 6 weeks. Debrided his toenails 1 through 5 bilateral.

## 2016-04-14 ENCOUNTER — Ambulatory Visit (INDEPENDENT_AMBULATORY_CARE_PROVIDER_SITE_OTHER): Payer: Medicare Other | Admitting: Podiatry

## 2016-04-14 ENCOUNTER — Encounter: Payer: Self-pay | Admitting: Podiatry

## 2016-04-14 DIAGNOSIS — S90552A Superficial foreign body, left ankle, initial encounter: Secondary | ICD-10-CM | POA: Diagnosis not present

## 2016-04-14 DIAGNOSIS — M722 Plantar fascial fibromatosis: Secondary | ICD-10-CM

## 2016-04-14 NOTE — Progress Notes (Signed)
He presents today with a chief complaint of a small nodule that moves around and about 1 in. area of his left medial ankle. He denies any trauma doesn't know what this is but states that he would like to have it removed. He also presents for follow-up of his plantar fibroma which she states is doing much better with much decrease in pain.  Objective: Vital signs are stable he is alert and oriented 3 pulses are palpable. Plantar fibromas have decreased considerably much much smaller than they were previously. An nontender on palpation and compression. He does have a small foreign body beneath the skin which moves in approximately 2 cm underneath the skin in any direction.  Assessment: Foreign body medial ankle left. Resolving plantar fibromas left.  Plan: We will follow up with him for the fibromas in a few weeks. However I did go ahead and localize the area today with local anesthetic Betadine skin prep to the leg may 5 mm incision expressed the foreign body through the excision it appears to be calcification. We did send this for pathology. The area was lavaged with alcohol. I then placed a single stitch through the wound 4-0 Prolene and then covered with a dry sterile compressive dressing which he will leave on for 2 days before getting wet.

## 2016-04-20 ENCOUNTER — Telehealth: Payer: Self-pay | Admitting: Podiatry

## 2016-04-20 MED ORDER — CEPHALEXIN 500 MG PO CAPS: 500.0000 mg | ORAL_CAPSULE | Freq: Three times a day (TID) | ORAL | 0 refills | Status: DC

## 2016-04-20 NOTE — Addendum Note (Signed)
Addended by: Alphia Kava'CONNELL, VALERY D on: 04/20/2016 01:11 PM   Modules accepted: Orders

## 2016-04-20 NOTE — Telephone Encounter (Signed)
Patient was seen in the office by Dr. Al CorpusHyatt on 04/14/16 and her surgically removed a lesion from his left ankle. Patient was told that if he developed redness/ pain to let us know. He said that he is having redness and tenderness and would like to get started on antibotics.  He would like a call back from nurse/ Patient uses [facility] [address] [place].

## 2016-04-20 NOTE — Telephone Encounter (Addendum)
Pt complains of redness and tenderness at site of lesion removal, request antibiotic.I informed pt of Dr. Geryl RankinsHyatt's orders and transferred to schedulers.

## 2016-04-20 NOTE — Telephone Encounter (Signed)
Start him on an antibiotic and have him come in to see some one. Keflex 500 tid if not allergic.

## 2016-04-21 ENCOUNTER — Ambulatory Visit (INDEPENDENT_AMBULATORY_CARE_PROVIDER_SITE_OTHER): Payer: Medicare Other | Admitting: Podiatry

## 2016-04-21 ENCOUNTER — Encounter: Payer: Self-pay | Admitting: Podiatry

## 2016-04-21 VITALS — Temp 97.9°F

## 2016-04-21 DIAGNOSIS — L03116 Cellulitis of left lower limb: Secondary | ICD-10-CM

## 2016-04-21 MED ORDER — MUPIROCIN 2 % EX OINT: TOPICAL_OINTMENT | CUTANEOUS | 2 refills | Status: DC

## 2016-04-21 NOTE — Progress Notes (Signed)
He presents today after excision of a small lesion anterior lateral ankle left last week. Suture remains in place he has a surrounding area of erythema. He states that he had some drainage but has since quit and looks a little better since we started him on the cephalexin 500 mg 3 times a day starting yesterday.  Objective: Vital signs are stable alert and oriented 3. Pulses are palpable. Stitches intact was removed does not demonstrate any dehiscence no purulence and no malodor. An area of erythema approximately 3 cm in diameter is present but does not appear to be sending. It does appear to be circumferential rami site and inflammatory event.  Assessment: Cellulitis/inflammation biopsy site anterior lateral leg left.  Plan: Removed sutures today he will continue his antibiotics he will dress this daily with Bactroban ointment which I will prescribe and gauze and paper tape.

## 2016-04-28 ENCOUNTER — Ambulatory Visit (INDEPENDENT_AMBULATORY_CARE_PROVIDER_SITE_OTHER): Payer: Medicare Other | Admitting: Podiatry

## 2016-04-28 ENCOUNTER — Encounter: Payer: Self-pay | Admitting: Podiatry

## 2016-04-28 DIAGNOSIS — M722 Plantar fascial fibromatosis: Secondary | ICD-10-CM

## 2016-04-28 DIAGNOSIS — L03116 Cellulitis of left lower limb: Secondary | ICD-10-CM

## 2016-04-28 NOTE — Progress Notes (Signed)
He presents today for follow-up of cellulitis to the left anterior leg. Pathology report was positive for a calcified fibromatous lesion. He states that the cellulitis appears to be resolving with the use of the antibiotic cream.  Objective: Vital signs are stable he's alert and oriented 3 much decrease in erythema and edema to the left leg.  Assessment: Resolving cellulitis.  Plan: Continue current therapies follow up with me should this worsen. I will go ahead and make an appointment for the next 2 weeks.

## 2016-05-12 ENCOUNTER — Other Ambulatory Visit: Payer: Self-pay | Admitting: Pain Medicine

## 2016-05-16 ENCOUNTER — Other Ambulatory Visit: Payer: Self-pay | Admitting: Pain Medicine

## 2016-05-17 ENCOUNTER — Ambulatory Visit: Payer: Medicare Other | Admitting: Podiatry

## 2016-05-24 ENCOUNTER — Ambulatory Visit (INDEPENDENT_AMBULATORY_CARE_PROVIDER_SITE_OTHER): Payer: Medicare Other | Admitting: Podiatry

## 2016-05-24 ENCOUNTER — Encounter: Payer: Self-pay | Admitting: Podiatry

## 2016-05-24 DIAGNOSIS — L03116 Cellulitis of left lower limb: Secondary | ICD-10-CM | POA: Diagnosis not present

## 2016-05-24 DIAGNOSIS — G629 Polyneuropathy, unspecified: Secondary | ICD-10-CM | POA: Insufficient documentation

## 2016-05-24 NOTE — Progress Notes (Signed)
He presents today for a follow-up of cellulitis to his left leg. He states that is doing much better is almost healed up completely. He's also complaining of his painful elongated toenails and the fact that he is diabetic he is wondering if this is one reason they are more painful.  Objective: Vital signs are stable he is alert and oriented 3 pulses remain palpable. Neurologic sensorium is slightly diminished.  Toenails elongated L dystrophic with mycotic painful palpation no signs of ulceration and cellulitis has resolved.  Assessment: Pain limb secondary onychomycosis and early diabetic peripheral neuropathy as well as pain in limb secondary to onychomycosis.  Plan: Debridement of toenails 1 through 5 bilateral covered service secondary to pain follow up with me in 3 months

## 2016-06-21 DIAGNOSIS — E1140 Type 2 diabetes mellitus with diabetic neuropathy, unspecified: Secondary | ICD-10-CM | POA: Insufficient documentation

## 2016-06-21 DIAGNOSIS — A419 Sepsis, unspecified organism: Secondary | ICD-10-CM | POA: Insufficient documentation

## 2016-06-21 DIAGNOSIS — L280 Lichen simplex chronicus: Secondary | ICD-10-CM | POA: Insufficient documentation

## 2016-08-23 ENCOUNTER — Ambulatory Visit: Payer: Medicare Other | Admitting: Podiatry

## 2017-02-03 IMAGING — CT CT MAXILLOFACIAL W/ CM
3 of 4 series · 15 of 47 positions shown, 18 images · IV contrast (omnipaque)
Comparison: None.

CLINICAL DATA: Right-sided facial and temporal region pain. Blurred
vision. Right-sided headache for 1 week. Recent dental abscess.

EXAM:
CT MAXILLOFACIAL WITH CONTRAST
TECHNIQUE: Multidetector CT imaging of the maxillofacial structures was
performed with intravenous contrast. Multiplanar CT image
reconstructions were also generated. A small metallic BB was placed
on the right temple in order to reliably differentiate right from
left.
CONTRAST:  75mL OMNIPAQUE IOHEXOL 300 MG/ML  SOLN

[Series 2: max soft · axial · 0.39mm/px · z∈[+376,+526]mm · 10 of 89 slices shown, 13 images]
[im 7/89  brain]
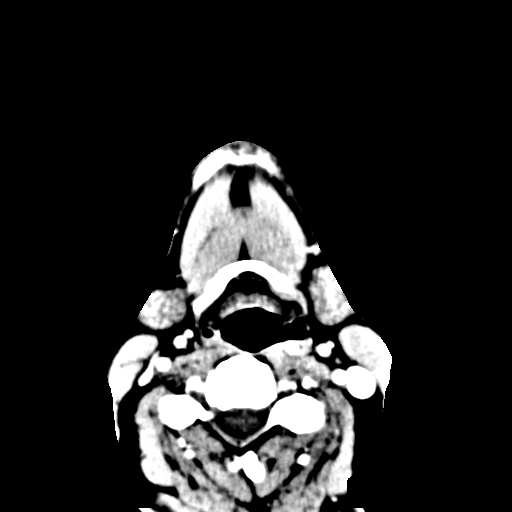
[im 7/89  bone]
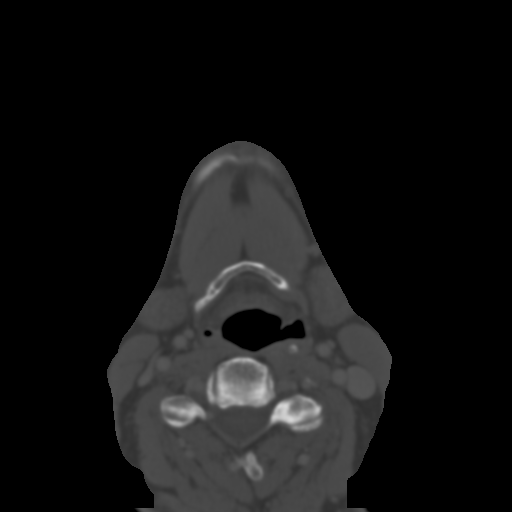
[im 16/89  bone]
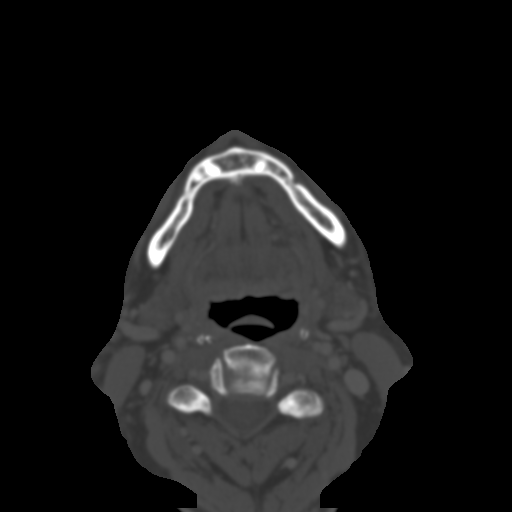
[im 25/89  bone]
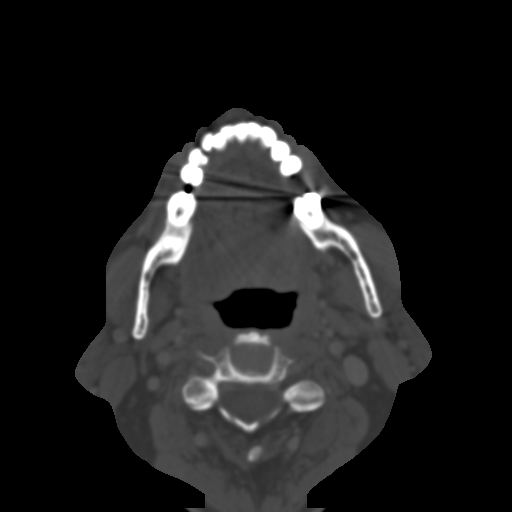
[im 31/89  bone]
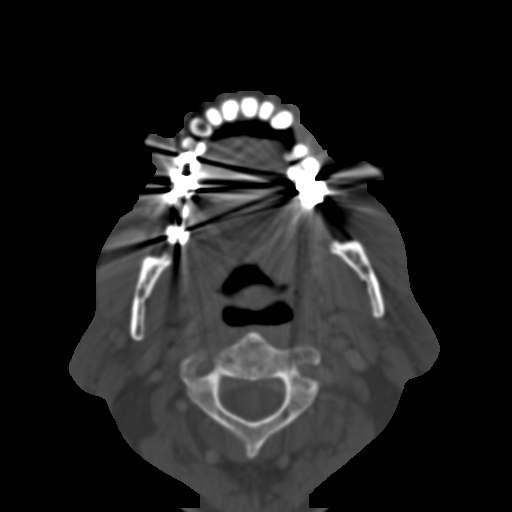
[im 40/89  brain]
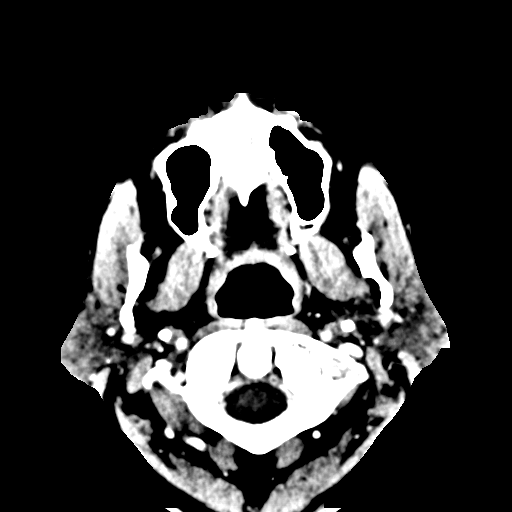
[im 40/89  bone]
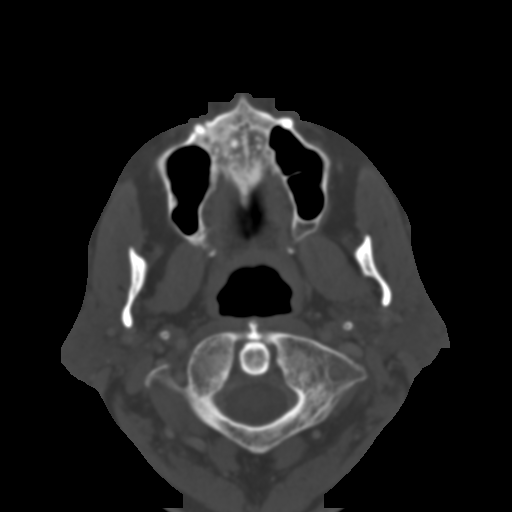
[im 49/89  bone]
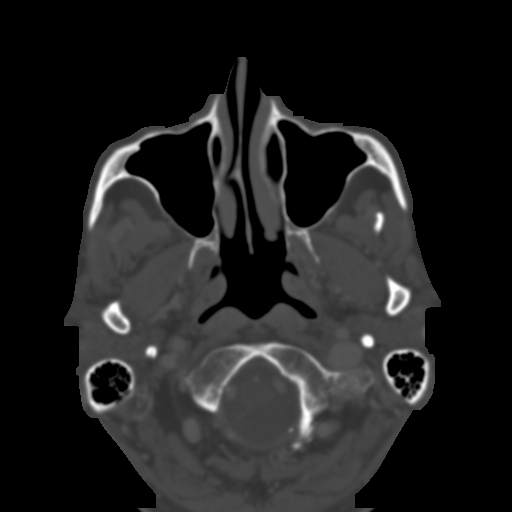
[im 58/89  bone]
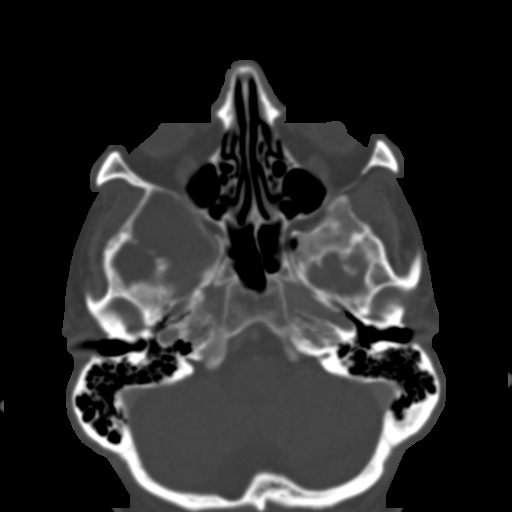
[im 67/89  bone]
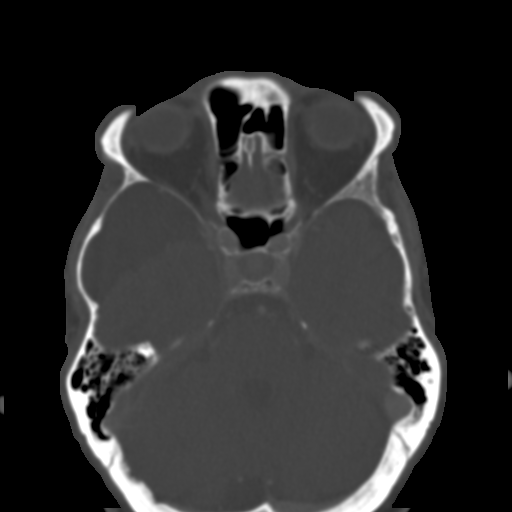
[im 73/89  brain]
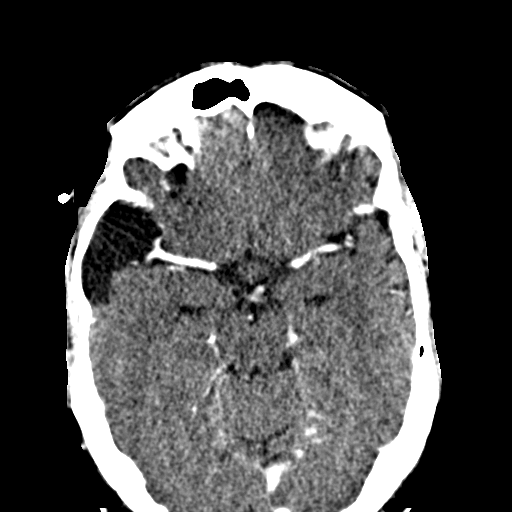
[im 73/89  bone]
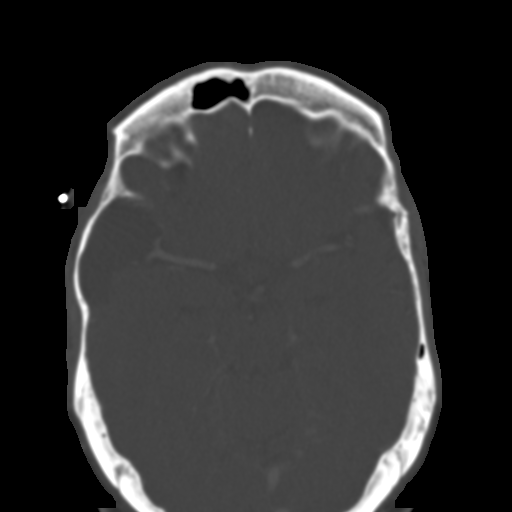
[im 82/89  bone]
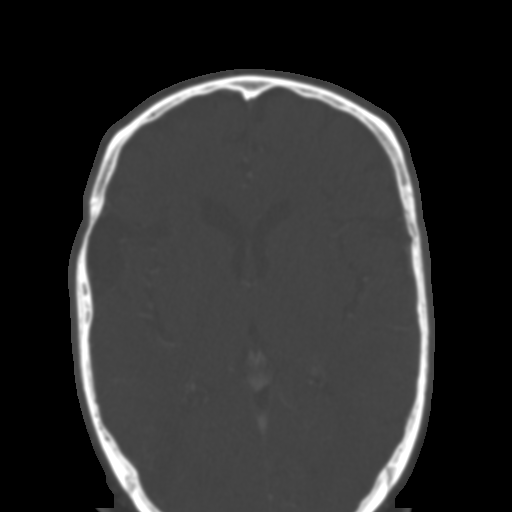

[Series 4: coronal soft · coronal · 0.37mm/px · 3 of 93 slices shown]
[im 31/93  bone]
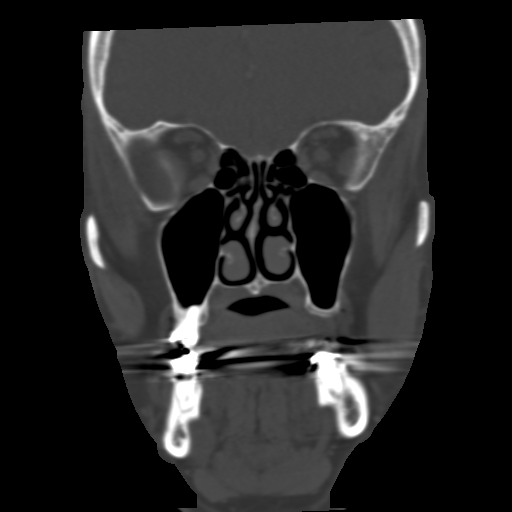
[im 41/93  bone]
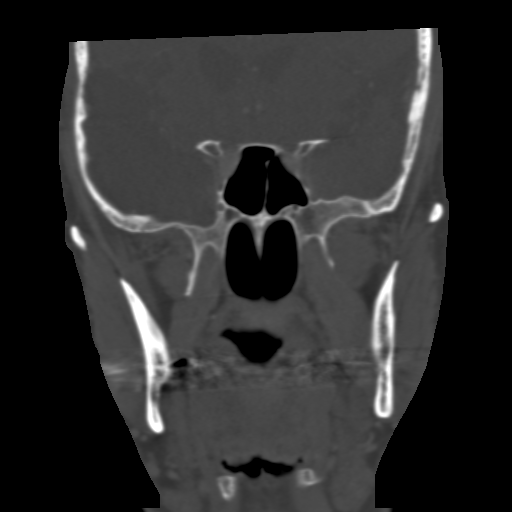
[im 52/93  bone]
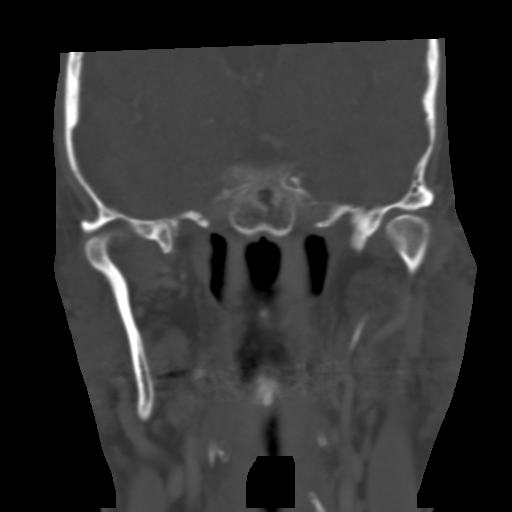

[Series 7: sagittal bone · sagittal · 0.37mm/px · 2 of 79 slices shown]
[im 27/79  bone]
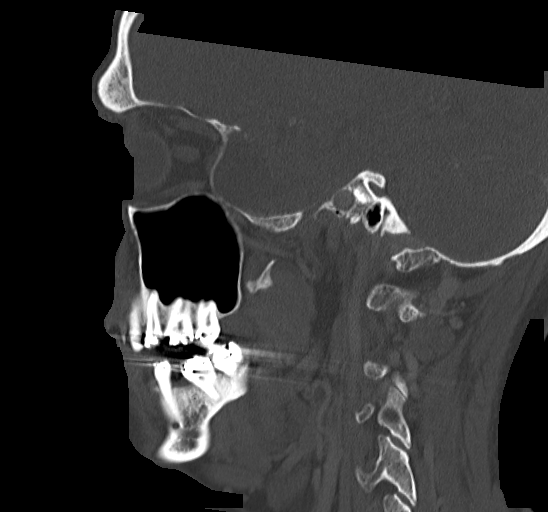
[im 53/79  bone]
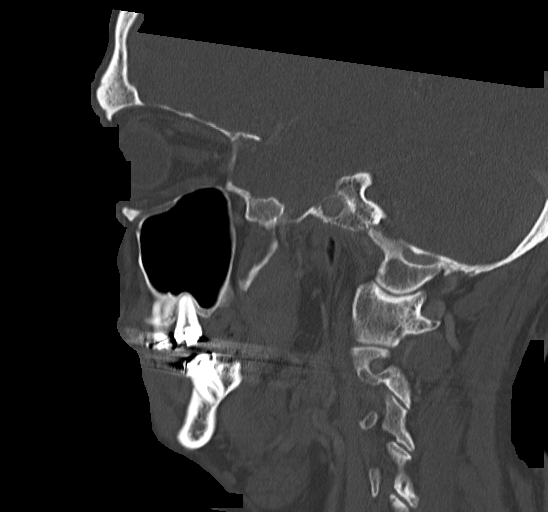

[15 of 47 positions shown; findings below may reference images not displayed]

FINDINGS: There is no fracture or dislocation. There is artifact in the mouth
region from multiple amalgam fillings. No dental region abscess is
appreciable by CT given the limitation of the artifact from the
dental amalgam. Elsewhere, there is no evidence of abnormal
enhancement or abscess. No intramuscular lesions are appreciable.
Salivary glands appear symmetric and normal bilaterally. There is no
erosive change or bony destruction.

No intraorbital lesions are identified. Orbits appear symmetric and
normal bilaterally. Paranasal sinuses are clear. Ostiomeatal unit
complexes are patent bilaterally. There is no bony destruction or
expansion. No air-fluid levels are identified.

The mastoid air cells are clear.

Intracranially, there is a large arachnoid cyst in the anterior
right temporal region measuring 4.9 x 4.5 cm with chronic remodeling
of the right temporal lobe, indicating chronicity of this finding.
The visualized brain parenchyma otherwise appears unremarkable.

The visualized pharyngeal air column appears normal. No lesions
involving the tongue region are identified. Peritonsillar regions
appear symmetric and within normal limits. Visualized cervical spine
appears unremarkable.
IMPRESSION: Sizable right anterior temporal arachnoid cyst. No enhancing lesion
or abscess is appreciable on this examination. Note that
susceptibility artifact from dental amalgam makes evaluation of the
dental regions less than optimal by CT.

Paranasal sinuses are clear. Ostiomeatal unit complexes are patent
bilaterally. No fracture or dislocation. No erosive change or bony
destruction. Mastoids bilaterally are clear.

## 2018-03-13 DIAGNOSIS — Z5181 Encounter for therapeutic drug level monitoring: Secondary | ICD-10-CM | POA: Diagnosis not present

## 2018-03-13 DIAGNOSIS — G894 Chronic pain syndrome: Secondary | ICD-10-CM | POA: Diagnosis not present

## 2018-03-13 DIAGNOSIS — Z79891 Long term (current) use of opiate analgesic: Secondary | ICD-10-CM | POA: Diagnosis not present

## 2018-03-13 DIAGNOSIS — E11610 Type 2 diabetes mellitus with diabetic neuropathic arthropathy: Secondary | ICD-10-CM | POA: Diagnosis not present

## 2018-03-14 DIAGNOSIS — I639 Cerebral infarction, unspecified: Secondary | ICD-10-CM | POA: Diagnosis not present

## 2018-03-14 DIAGNOSIS — E119 Type 2 diabetes mellitus without complications: Secondary | ICD-10-CM | POA: Diagnosis not present

## 2018-03-14 DIAGNOSIS — R42 Dizziness and giddiness: Secondary | ICD-10-CM | POA: Diagnosis not present

## 2018-03-14 DIAGNOSIS — R197 Diarrhea, unspecified: Secondary | ICD-10-CM | POA: Diagnosis not present

## 2018-03-14 DIAGNOSIS — I452 Bifascicular block: Secondary | ICD-10-CM | POA: Diagnosis not present

## 2018-03-14 DIAGNOSIS — G9340 Encephalopathy, unspecified: Secondary | ICD-10-CM | POA: Diagnosis not present

## 2018-03-14 DIAGNOSIS — F19921 Other psychoactive substance use, unspecified with intoxication with delirium: Secondary | ICD-10-CM | POA: Diagnosis not present

## 2018-03-14 DIAGNOSIS — E1140 Type 2 diabetes mellitus with diabetic neuropathy, unspecified: Secondary | ICD-10-CM | POA: Diagnosis not present

## 2018-03-14 DIAGNOSIS — Z8673 Personal history of transient ischemic attack (TIA), and cerebral infarction without residual deficits: Secondary | ICD-10-CM | POA: Diagnosis not present

## 2018-03-14 DIAGNOSIS — E11621 Type 2 diabetes mellitus with foot ulcer: Secondary | ICD-10-CM | POA: Diagnosis not present

## 2018-03-14 DIAGNOSIS — L989 Disorder of the skin and subcutaneous tissue, unspecified: Secondary | ICD-10-CM | POA: Diagnosis not present

## 2018-03-14 DIAGNOSIS — E785 Hyperlipidemia, unspecified: Secondary | ICD-10-CM | POA: Diagnosis not present

## 2018-03-14 DIAGNOSIS — R000 Tachycardia, unspecified: Secondary | ICD-10-CM | POA: Diagnosis not present

## 2018-03-14 DIAGNOSIS — G930 Cerebral cysts: Secondary | ICD-10-CM | POA: Diagnosis not present

## 2018-03-14 DIAGNOSIS — R51 Headache: Secondary | ICD-10-CM | POA: Diagnosis not present

## 2018-03-14 DIAGNOSIS — I10 Essential (primary) hypertension: Secondary | ICD-10-CM | POA: Diagnosis not present

## 2018-03-14 DIAGNOSIS — I493 Ventricular premature depolarization: Secondary | ICD-10-CM | POA: Diagnosis not present

## 2018-03-14 DIAGNOSIS — E7800 Pure hypercholesterolemia, unspecified: Secondary | ICD-10-CM | POA: Diagnosis not present

## 2018-03-14 DIAGNOSIS — G459 Transient cerebral ischemic attack, unspecified: Secondary | ICD-10-CM | POA: Diagnosis not present

## 2018-03-14 DIAGNOSIS — K219 Gastro-esophageal reflux disease without esophagitis: Secondary | ICD-10-CM | POA: Diagnosis not present

## 2018-03-14 DIAGNOSIS — R4182 Altered mental status, unspecified: Secondary | ICD-10-CM | POA: Diagnosis not present

## 2018-03-14 DIAGNOSIS — I2510 Atherosclerotic heart disease of native coronary artery without angina pectoris: Secondary | ICD-10-CM | POA: Diagnosis not present

## 2018-03-14 DIAGNOSIS — M79605 Pain in left leg: Secondary | ICD-10-CM | POA: Diagnosis not present

## 2018-03-14 DIAGNOSIS — H539 Unspecified visual disturbance: Secondary | ICD-10-CM | POA: Diagnosis not present

## 2018-03-14 DIAGNOSIS — R410 Disorientation, unspecified: Secondary | ICD-10-CM | POA: Diagnosis not present

## 2018-03-14 DIAGNOSIS — I63511 Cerebral infarction due to unspecified occlusion or stenosis of right middle cerebral artery: Secondary | ICD-10-CM | POA: Diagnosis not present

## 2018-03-14 DIAGNOSIS — I440 Atrioventricular block, first degree: Secondary | ICD-10-CM | POA: Diagnosis not present

## 2018-03-14 DIAGNOSIS — I361 Nonrheumatic tricuspid (valve) insufficiency: Secondary | ICD-10-CM | POA: Diagnosis not present

## 2018-03-14 DIAGNOSIS — R39198 Other difficulties with micturition: Secondary | ICD-10-CM | POA: Diagnosis not present

## 2018-03-14 DIAGNOSIS — E1142 Type 2 diabetes mellitus with diabetic polyneuropathy: Secondary | ICD-10-CM | POA: Diagnosis not present

## 2018-03-14 DIAGNOSIS — L97529 Non-pressure chronic ulcer of other part of left foot with unspecified severity: Secondary | ICD-10-CM | POA: Diagnosis not present

## 2018-03-14 DIAGNOSIS — E1121 Type 2 diabetes mellitus with diabetic nephropathy: Secondary | ICD-10-CM | POA: Diagnosis not present

## 2018-03-14 DIAGNOSIS — Z794 Long term (current) use of insulin: Secondary | ICD-10-CM | POA: Diagnosis not present

## 2018-03-15 DIAGNOSIS — Z8619 Personal history of other infectious and parasitic diseases: Secondary | ICD-10-CM | POA: Insufficient documentation

## 2018-03-15 DIAGNOSIS — R4182 Altered mental status, unspecified: Secondary | ICD-10-CM | POA: Insufficient documentation

## 2018-03-28 DIAGNOSIS — I639 Cerebral infarction, unspecified: Secondary | ICD-10-CM | POA: Diagnosis not present

## 2018-03-28 DIAGNOSIS — G4700 Insomnia, unspecified: Secondary | ICD-10-CM | POA: Diagnosis not present

## 2018-03-28 DIAGNOSIS — R410 Disorientation, unspecified: Secondary | ICD-10-CM | POA: Diagnosis not present

## 2018-03-28 DIAGNOSIS — I69311 Memory deficit following cerebral infarction: Secondary | ICD-10-CM | POA: Insufficient documentation

## 2018-03-28 DIAGNOSIS — I2510 Atherosclerotic heart disease of native coronary artery without angina pectoris: Secondary | ICD-10-CM | POA: Diagnosis not present

## 2018-03-28 DIAGNOSIS — R911 Solitary pulmonary nodule: Secondary | ICD-10-CM | POA: Diagnosis not present

## 2018-03-28 DIAGNOSIS — E1142 Type 2 diabetes mellitus with diabetic polyneuropathy: Secondary | ICD-10-CM | POA: Diagnosis not present

## 2018-03-28 DIAGNOSIS — Z794 Long term (current) use of insulin: Secondary | ICD-10-CM | POA: Diagnosis not present

## 2018-03-28 DIAGNOSIS — Z1159 Encounter for screening for other viral diseases: Secondary | ICD-10-CM | POA: Diagnosis not present

## 2018-03-28 DIAGNOSIS — L280 Lichen simplex chronicus: Secondary | ICD-10-CM | POA: Diagnosis not present

## 2018-04-04 DIAGNOSIS — Z794 Long term (current) use of insulin: Secondary | ICD-10-CM | POA: Diagnosis not present

## 2018-04-04 DIAGNOSIS — E1142 Type 2 diabetes mellitus with diabetic polyneuropathy: Secondary | ICD-10-CM | POA: Diagnosis not present

## 2018-05-02 DIAGNOSIS — I10 Essential (primary) hypertension: Secondary | ICD-10-CM | POA: Diagnosis not present

## 2018-05-02 DIAGNOSIS — M79675 Pain in left toe(s): Secondary | ICD-10-CM | POA: Diagnosis not present

## 2018-05-02 DIAGNOSIS — E785 Hyperlipidemia, unspecified: Secondary | ICD-10-CM | POA: Diagnosis not present

## 2018-05-02 DIAGNOSIS — M722 Plantar fascial fibromatosis: Secondary | ICD-10-CM | POA: Diagnosis not present

## 2018-05-02 DIAGNOSIS — Z0000 Encounter for general adult medical examination without abnormal findings: Secondary | ICD-10-CM | POA: Diagnosis not present

## 2018-05-02 DIAGNOSIS — M25561 Pain in right knee: Secondary | ICD-10-CM | POA: Diagnosis not present

## 2018-05-02 DIAGNOSIS — Z1159 Encounter for screening for other viral diseases: Secondary | ICD-10-CM | POA: Diagnosis not present

## 2018-05-02 DIAGNOSIS — B351 Tinea unguium: Secondary | ICD-10-CM | POA: Diagnosis not present

## 2018-05-02 DIAGNOSIS — M25562 Pain in left knee: Secondary | ICD-10-CM | POA: Diagnosis not present

## 2018-05-02 DIAGNOSIS — M25569 Pain in unspecified knee: Secondary | ICD-10-CM | POA: Diagnosis not present

## 2018-05-02 DIAGNOSIS — E1142 Type 2 diabetes mellitus with diabetic polyneuropathy: Secondary | ICD-10-CM | POA: Diagnosis not present

## 2018-05-02 DIAGNOSIS — E1140 Type 2 diabetes mellitus with diabetic neuropathy, unspecified: Secondary | ICD-10-CM | POA: Diagnosis not present

## 2018-05-02 DIAGNOSIS — Z794 Long term (current) use of insulin: Secondary | ICD-10-CM | POA: Diagnosis not present

## 2018-05-15 DIAGNOSIS — E1142 Type 2 diabetes mellitus with diabetic polyneuropathy: Secondary | ICD-10-CM | POA: Diagnosis not present

## 2018-05-15 DIAGNOSIS — Z794 Long term (current) use of insulin: Secondary | ICD-10-CM | POA: Diagnosis not present

## 2018-05-24 ENCOUNTER — Ambulatory Visit (INDEPENDENT_AMBULATORY_CARE_PROVIDER_SITE_OTHER): Payer: Medicare HMO | Admitting: Podiatry

## 2018-05-24 ENCOUNTER — Encounter: Payer: Self-pay | Admitting: Podiatry

## 2018-05-24 ENCOUNTER — Other Ambulatory Visit: Payer: Self-pay

## 2018-05-24 DIAGNOSIS — M722 Plantar fascial fibromatosis: Secondary | ICD-10-CM | POA: Diagnosis not present

## 2018-05-24 DIAGNOSIS — L603 Nail dystrophy: Secondary | ICD-10-CM | POA: Diagnosis not present

## 2018-05-24 NOTE — Progress Notes (Signed)
He presents today chief complaint of a painful area to the plantar aspect of his left foot.  States that years ago I injected the plantar fibromas and they went away.  He said the left foot is giving me if it at this point I like to have these injected again if possible.  Objective: Vital signs are stable he is alert and oriented x3 2 large palpable masses plantar aspect of his left foot along the medial band of the plantar fascia.  These are greater than a centimeter diameter.  He also has pain on palpation medial calcaneal tubercle.  Assessment: Plantar fibromas left.  Plantar fasciitis left.  Plan: After sterile Betadine skin prep is entire area injected 20 mg Kenalog 5 mg Marcaine to the point of maximal tenderness of each plantar fibroma and then once to the heel.  3 injections total.  He tolerated procedure well without complications.  We will follow-up with him in about 6 weeks.  He is already taking terbinafine for his onychomycosis.

## 2018-06-07 ENCOUNTER — Other Ambulatory Visit: Payer: Self-pay

## 2018-06-07 ENCOUNTER — Ambulatory Visit (INDEPENDENT_AMBULATORY_CARE_PROVIDER_SITE_OTHER): Payer: Medicare HMO | Admitting: Orthotics

## 2018-06-07 DIAGNOSIS — M722 Plantar fascial fibromatosis: Secondary | ICD-10-CM

## 2018-06-07 DIAGNOSIS — L603 Nail dystrophy: Secondary | ICD-10-CM

## 2018-06-07 NOTE — Progress Notes (Signed)
Patient was seen today for offloading painful plantar fibromas/keratomas.  Area of concerned was marked and patient was scanned/cast to offload the keratoma/fibroma.  A LW accomodative device will be fabricated for the patient with appropriate offloads.  

## 2018-06-13 DIAGNOSIS — J302 Other seasonal allergic rhinitis: Secondary | ICD-10-CM | POA: Diagnosis not present

## 2018-06-13 DIAGNOSIS — Z794 Long term (current) use of insulin: Secondary | ICD-10-CM | POA: Diagnosis not present

## 2018-06-13 DIAGNOSIS — E1142 Type 2 diabetes mellitus with diabetic polyneuropathy: Secondary | ICD-10-CM | POA: Diagnosis not present

## 2018-06-13 DIAGNOSIS — B351 Tinea unguium: Secondary | ICD-10-CM | POA: Diagnosis not present

## 2018-06-28 ENCOUNTER — Ambulatory Visit: Payer: Medicare HMO | Admitting: Orthotics

## 2018-06-28 ENCOUNTER — Other Ambulatory Visit: Payer: Self-pay

## 2018-06-28 DIAGNOSIS — E1140 Type 2 diabetes mellitus with diabetic neuropathy, unspecified: Secondary | ICD-10-CM

## 2018-06-28 DIAGNOSIS — M722 Plantar fascial fibromatosis: Secondary | ICD-10-CM

## 2018-06-28 DIAGNOSIS — G629 Polyneuropathy, unspecified: Secondary | ICD-10-CM

## 2018-06-28 NOTE — Progress Notes (Signed)
Patient came in today to pick up custom made foot orthotics.  The goals were accomplished and the patient reported no dissatisfaction with said orthotics.  Patient was advised of breakin period and how to report any issues. 

## 2018-07-05 ENCOUNTER — Ambulatory Visit: Payer: Medicare HMO | Admitting: Podiatry

## 2018-07-05 ENCOUNTER — Other Ambulatory Visit: Payer: Self-pay

## 2018-07-05 ENCOUNTER — Encounter: Payer: Self-pay | Admitting: Podiatry

## 2018-07-05 ENCOUNTER — Ambulatory Visit (INDEPENDENT_AMBULATORY_CARE_PROVIDER_SITE_OTHER): Payer: Medicare HMO | Admitting: Podiatry

## 2018-07-05 VITALS — Temp 97.1°F

## 2018-07-05 DIAGNOSIS — M722 Plantar fascial fibromatosis: Secondary | ICD-10-CM

## 2018-07-05 NOTE — Progress Notes (Signed)
He presents today for follow-up of his plantar fibromatosis states that is really not doing any better still hurts in the orthotic seems to bother it to.  Objective: Vital signs stable alert and oriented x3.  Pulses are palpable.  He still has pain on palpation to the plantar fibromas he has gone from 3 plantar fibromas to 2 plantar fibromas and they are smaller than a centimeter in diameter.  They are mildly tender on palpation but appear to me to be much better much improved than previous evaluation.  Assessment: Plantar fibromatosis resolving left medial band.  Plan: I reinjected them today with about 10 mg of Kenalog with a 30-gauge half inch needle tolerated procedure well without complications I also had Rick to reevaluate the orthotics.  I will follow-up with him in about 6 weeks to see if he is improved.  May need to consider MRI otherwise

## 2018-07-14 DIAGNOSIS — Z794 Long term (current) use of insulin: Secondary | ICD-10-CM | POA: Diagnosis not present

## 2018-07-14 DIAGNOSIS — E1142 Type 2 diabetes mellitus with diabetic polyneuropathy: Secondary | ICD-10-CM | POA: Diagnosis not present

## 2018-08-02 DIAGNOSIS — R030 Elevated blood-pressure reading, without diagnosis of hypertension: Secondary | ICD-10-CM | POA: Diagnosis not present

## 2018-08-02 DIAGNOSIS — R0989 Other specified symptoms and signs involving the circulatory and respiratory systems: Secondary | ICD-10-CM | POA: Diagnosis not present

## 2018-08-02 DIAGNOSIS — Z794 Long term (current) use of insulin: Secondary | ICD-10-CM | POA: Diagnosis not present

## 2018-08-02 DIAGNOSIS — R439 Unspecified disturbances of smell and taste: Secondary | ICD-10-CM | POA: Diagnosis not present

## 2018-08-02 DIAGNOSIS — Z20828 Contact with and (suspected) exposure to other viral communicable diseases: Secondary | ICD-10-CM | POA: Diagnosis not present

## 2018-08-02 DIAGNOSIS — R05 Cough: Secondary | ICD-10-CM | POA: Diagnosis not present

## 2018-08-02 DIAGNOSIS — E119 Type 2 diabetes mellitus without complications: Secondary | ICD-10-CM | POA: Diagnosis not present

## 2018-08-02 DIAGNOSIS — I2510 Atherosclerotic heart disease of native coronary artery without angina pectoris: Secondary | ICD-10-CM | POA: Diagnosis not present

## 2018-08-16 ENCOUNTER — Other Ambulatory Visit: Payer: Self-pay

## 2018-08-16 ENCOUNTER — Ambulatory Visit (INDEPENDENT_AMBULATORY_CARE_PROVIDER_SITE_OTHER): Payer: Medicare HMO | Admitting: Podiatry

## 2018-08-16 ENCOUNTER — Encounter: Payer: Self-pay | Admitting: Podiatry

## 2018-08-16 VITALS — Temp 97.8°F

## 2018-08-16 DIAGNOSIS — M722 Plantar fascial fibromatosis: Secondary | ICD-10-CM

## 2018-08-16 NOTE — Progress Notes (Signed)
He presents today states that his orthotics are doing just great.  He really likes them he states that the plantar fibroma and plantar fasciitis of the left foot have improved greater than 50%.  He states that he is will be okay with this.  Objective: Vital signs are stable he is alert and oriented x3 no open lesions or wounds pulses remain palpable left the lesion has decreased by approximately 50% in his diameter and depth.  He has no pain on palpation medial calcaneal tubercle left.  Assessment: Well-healing plantar fasciitis left well-healing plantar fibromatosis left.  Plan: Continue the use of the orthotics follow-up with me as up on a as needed basis however should his lesion begin to grow he is to notify me immediately.

## 2019-02-13 ENCOUNTER — Other Ambulatory Visit: Payer: Self-pay

## 2019-02-13 ENCOUNTER — Ambulatory Visit: Payer: Medicare HMO | Admitting: Podiatry

## 2019-02-13 DIAGNOSIS — L03031 Cellulitis of right toe: Secondary | ICD-10-CM

## 2019-02-13 DIAGNOSIS — M79674 Pain in right toe(s): Secondary | ICD-10-CM | POA: Diagnosis not present

## 2019-02-13 DIAGNOSIS — L02611 Cutaneous abscess of right foot: Secondary | ICD-10-CM

## 2019-02-13 DIAGNOSIS — L600 Ingrowing nail: Secondary | ICD-10-CM

## 2019-02-13 MED ORDER — DOXYCYCLINE HYCLATE 100 MG PO TABS: 100.0000 mg | ORAL_TABLET | Freq: Two times a day (BID) | ORAL | 0 refills | Status: DC

## 2019-02-14 ENCOUNTER — Encounter: Payer: Self-pay | Admitting: Podiatry

## 2019-02-14 NOTE — Progress Notes (Signed)
Subjective:  Patient ID: Shawn Wagner, male    DOB: 11/19/1950,  MRN: [id number]  Chief Complaint  Patient presents with  . Nail Problem    pt is here for an ingrown toenail of the right big toenail, lateral side, as well as the left big toenail medial side, toenail fungus has been going on for about 4-5 days, pt states that he is also a diabetic type 2 as well, and is concerned about further infection    68 y.o. male presents with the above complaint.  Patient is a type II diabetic with presents with right ingrown toenail on the right side lateral border with acute infection with erythema greater than 2 cm.  He stated got worse about 4 to 5 days.  He denies doing anything besides soaking it.  He states that starting to swell and drain purulent drainage.  His sugars are uncontrolled with last A1c of 12.1 per patient.  He denies any other acute complaints nausea fever chills vomiting.  He states this has been causing him some pain.  He states that he is completely neuropathic to his feet.   Review of Systems: Negative except as noted in the HPI. Denies N/V/F/Ch.  Past Medical History:  Diagnosis Date  . Diabetes mellitus without complication (HCC)     Current Outpatient Medications:  .  atorvastatin (LIPITOR) 80 MG tablet, Take by mouth., Disp: , Rfl:  .  blood glucose meter kit and supplies KIT, Dispense based on patient and insurance preference. Use up to four times daily as directed. (FOR ICD-9 250.00, 250.01)., Disp: 1 each, Rfl: 0 .  DULoxetine (CYMBALTA) 30 MG capsule, Take by mouth., Disp: , Rfl:  .  gabapentin (NEURONTIN) 300 MG capsule, Take 300 mg by mouth., Disp: , Rfl:  .  insulin NPH-regular Human (NOVOLIN 70/30) (70-30) 100 UNIT/ML injection, Take 30 units in the morning before breakfast and 30 units in the evening before dinner., Disp: , Rfl:  .  lisinopril (PRINIVIL,ZESTRIL) 2.5 MG tablet, Take by mouth., Disp: , Rfl:  .  loratadine (CLARITIN) 10 MG tablet, Take by  mouth., Disp: , Rfl:  .  losartan (COZAAR) 25 MG tablet, Take 25 mg by mouth daily., Disp: , Rfl:  .  metoprolol tartrate (LOPRESSOR) 25 MG tablet, Take by mouth., Disp: , Rfl:  .  omega-3 acid ethyl esters (LOVAZA) 1 g capsule, Take by mouth., Disp: , Rfl:  .  omeprazole (PRILOSEC) 20 MG capsule, , Disp: , Rfl:  .  terbinafine (LAMISIL) 250 MG tablet, , Disp: , Rfl:  .  traZODone (DESYREL) 50 MG tablet, , Disp: , Rfl:  .  doxycycline (VIBRA-TABS) 100 MG tablet, Take 1 tablet (100 mg total) by mouth 2 (two) times daily., Disp: 20 tablet, Rfl: 0 .  FLUoxetine (PROZAC) 10 MG capsule, Take 10 mg by mouth., Disp: , Rfl:  .  glipiZIDE (GLUCOTROL) 5 MG tablet, Take 5 mg by mouth., Disp: , Rfl:  .  insulin NPH Human (HUMULIN N,NOVOLIN N) 100 UNIT/ML injection, Inject into the skin., Disp: , Rfl:  .  metFORMIN (GLUCOPHAGE) 1000 MG tablet, Take 1 tablet (1,000 mg total) by mouth 2 (two) times daily with a meal., Disp: 60 tablet, Rfl: 0  Social History   Tobacco Use  Smoking Status Never Smoker  Smokeless Tobacco Never Used    Allergies  Allergen Reactions  . Vicodin [Hydrocodone-Acetaminophen]     Night sweats   Objective:  There were no vitals filed for this visit. There   is no height or weight on file to calculate BMI. Constitutional Well developed. Well nourished.  Vascular Dorsalis pedis pulses palpable bilaterally. Posterior tibial pulses palpable bilaterally. Capillary refill normal to all digits.  No cyanosis or clubbing noted. Pedal hair growth normal.  Neurologic Normal speech. Oriented to person, place, and time. Epicritic sensation to light touch grossly present bilaterally.  Dermatologic Painful ingrowing nail at lateral nail borders of the hallux nail with a superficial abscess right. No other open wounds. No skin lesions.  Orthopedic: Normal joint ROM without pain or crepitus bilaterally. No visible deformities. No bony tenderness.   Radiographs: None Assessment:    1. Abscess of great toe, right   2. Ingrown toenail of right foot   3. Toe pain, right    Plan:  Patient was evaluated and treated and all questions answered.  Ingrown Nail, right with a superficial abscess -Patient elects to proceed with minor surgery to perform incision and drainage with removal of ingrown toenail removal today. Consent reviewed and signed by patient. -Ingrown nail excised. See procedure note. -Educated on post-procedure care including soaking. Written instructions provided and reviewed. -Patient to follow up in 2 weeks for nail check. -Doxycycline was dispensed for skin and soft tissue infection given that patient has erythema greater than 2 cm.  Procedure: Incision and drainage of the superficial abscess with excision of Ingrown Toenail Location: Right 1st toe lateral nail borders. Anesthesia: Lidocaine 1% plain; 1.5 mL and Marcaine 0.5% plain; 1.5 mL, digital block. Skin Prep: Betadine. Dressing: Silvadene; telfa; dry, sterile, compression dressing. Technique: Following skin prep, the toe was exsanguinated and a tourniquet was secured at the base of the toe.  Using a blade, superficial abscess was drained with purulent drainage.  The affected nail border was freed, split with a nail splitter, and excised. Chemical matrixectomy was then performed with phenol and irrigated out with alcohol. The tourniquet was then removed and sterile dressing applied. Disposition: Patient tolerated procedure well. Patient to return in 2 weeks for follow-up.   No follow-ups on file. 

## 2019-02-27 ENCOUNTER — Ambulatory Visit: Payer: Medicare Other | Admitting: Podiatry

## 2019-03-20 ENCOUNTER — Ambulatory Visit: Payer: Medicare HMO | Admitting: Podiatry

## 2019-03-20 ENCOUNTER — Other Ambulatory Visit: Payer: Self-pay

## 2019-03-20 DIAGNOSIS — E1140 Type 2 diabetes mellitus with diabetic neuropathy, unspecified: Secondary | ICD-10-CM | POA: Diagnosis not present

## 2019-03-20 DIAGNOSIS — L989 Disorder of the skin and subcutaneous tissue, unspecified: Secondary | ICD-10-CM

## 2019-03-20 DIAGNOSIS — S90211A Contusion of right great toe with damage to nail, initial encounter: Secondary | ICD-10-CM

## 2019-03-20 DIAGNOSIS — L603 Nail dystrophy: Secondary | ICD-10-CM | POA: Diagnosis not present

## 2019-03-22 ENCOUNTER — Encounter: Payer: Self-pay | Admitting: Podiatry

## 2019-03-22 NOTE — Progress Notes (Signed)
Subjective:  Patient ID: Shawn Wagner, male    DOB: 03-03-1951,  MRN: [id number]  Chief Complaint  Patient presents with  . Nail Problem    pt's nail fell off when trying to take off bandaid, pt is concerned that his nail will grow back to be ingrown, pt is also requesting to get a refill on prescription as well    69 y.o. male presents with the above complaint.  Patient presents with a follow-up of right hallux nail dystrophy/contusion.  Patient states that there is some pain associated with right hallux.  Patient had undergone incision and drainage of a superficial abscess with excision of toenail of the right lateral border.  However patient states that overall is looking better however is not quite healed.  But today patient is presenting with toenail that is mostly detached and was removed upon taking the bandaging off.  He states that he just wanted to make sure this is not getting infected.  He also has secondary complaints of multiple small lesions across the lower extremity that has been going on for over the years.  Patient states he has a issue of itching them and is not able to control it.  He would like to know if there is anything that can be applied for that.  He states that he does it while in his sleep and cannot control the itching.  He denies any other acute complaints.   Review of Systems: Negative except as noted in the HPI. Denies N/V/F/Ch.  Past Medical History:  Diagnosis Date  . Diabetes mellitus without complication ([place])     Current Outpatient Medications:  .  aspirin 81 MG EC tablet, Take by mouth., Disp: , Rfl:  .  atorvastatin (LIPITOR) 80 MG tablet, Take by mouth., Disp: , Rfl:  .  blood glucose meter kit and supplies KIT, Dispense based on patient and insurance preference. Use up to four times daily as directed. (FOR ICD-9 250.00, 250.01)., Disp: 1 each, Rfl: 0 .  doxycycline (VIBRA-TABS) 100 MG tablet, Take 1 tablet (100 mg total) by mouth 2 (two) times  daily., Disp: 20 tablet, Rfl: 0 .  DULoxetine (CYMBALTA) 30 MG capsule, Take by mouth., Disp: , Rfl:  .  fluticasone (FLONASE) 50 MCG/ACT nasal spray, , Disp: , Rfl:  .  gabapentin (NEURONTIN) 300 MG capsule, Take 300 mg by mouth., Disp: , Rfl:  .  insulin NPH-regular Human (NOVOLIN 70/30) (70-30) 100 UNIT/ML injection, Take 30 units in the morning before breakfast and 30 units in the evening before dinner., Disp: , Rfl:  .  lisinopril (PRINIVIL,ZESTRIL) 2.5 MG tablet, Take by mouth., Disp: , Rfl:  .  loratadine (CLARITIN) 10 MG tablet, Take by mouth., Disp: , Rfl:  .  losartan (COZAAR) 25 MG tablet, Take 25 mg by mouth daily., Disp: , Rfl:  .  metoprolol tartrate (LOPRESSOR) 25 MG tablet, Take by mouth., Disp: , Rfl:  .  omega-3 acid ethyl esters (LOVAZA) 1 g capsule, Take by mouth., Disp: , Rfl:  .  Omega-3 Fatty Acids (FISH OIL) 1000 MG CAPS, Take by mouth., Disp: , Rfl:  .  omeprazole (PRILOSEC) 20 MG capsule, , Disp: , Rfl:  .  terbinafine (LAMISIL) 250 MG tablet, , Disp: , Rfl:  .  traZODone (DESYREL) 50 MG tablet, , Disp: , Rfl:  .  FLUoxetine (PROZAC) 10 MG capsule, Take 10 mg by mouth., Disp: , Rfl:  .  glipiZIDE (GLUCOTROL) 5 MG tablet, Take 5 mg by mouth.,  Disp: , Rfl:  .  insulin NPH Human (HUMULIN N,NOVOLIN N) 100 UNIT/ML injection, Inject into the skin., Disp: , Rfl:  .  metFORMIN (GLUCOPHAGE) 1000 MG tablet, Take 1 tablet (1,000 mg total) by mouth 2 (two) times daily with a meal., Disp: 60 tablet, Rfl: 0  Social History   Tobacco Use  Smoking Status Never Smoker  Smokeless Tobacco Never Used    Allergies  Allergen Reactions  . Vicodin [Hydrocodone-Acetaminophen]     Night sweats   Objective:  There were no vitals filed for this visit. There is no height or weight on file to calculate BMI. Constitutional Well developed. Well nourished.  Vascular Dorsalis pedis pulses palpable bilaterally. Posterior tibial pulses palpable bilaterally. Capillary refill normal to all  digits.  No cyanosis or clubbing noted. Pedal hair growth normal.  Neurologic Normal speech. Oriented to person, place, and time. Epicritic sensation to light touch grossly present bilaterally.  Dermatologic  right hallux nail bed inspected without any laceration.  There is mild thickening of the nail bed noted as expected from self avulsion of hallux toenail. Multiple small benign lesions noted with surrounding mild erythema across the lower extremity as well as the upper extremity.  This is likely due to psychogenic excoriation and causing it to spread.  Orthopedic: Normal joint ROM without pain or crepitus bilaterally. No visible deformities. No bony tenderness.   Radiographs: None Assessment:   1. Diabetic neuropathy, painful ([place])   2. Nail dystrophy   3. Benign skin lesion   4. Contusion of right great toe with damage to nail, initial encounter    Plan:  Patient was evaluated and treated and all questions answered.  Right hallux nail dystrophy -It appears about taking the Band-Aid off, the nail itself self avulsed off of the hallux.  It appears the nail had become loose from the underlying nail bed and it kept catching on socks causing it to further become loose.  However there is no bleeding noted.  No signs of infection noted.  It appears that the nail will grow back in the same position as it previously was. -I explained to the patient that there is nothing further to do beside waiting for the new nail to grow back in 8 to 12 months.  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  I stated the patient that further prevent itching especially given his as this is causing the lesions to spread throughout his lower extremity.  I explained to him that if needed he can wear an ingrown and therapy so that when he is not patient has lower extremity.  He  states that he will do that.  For now I will hold off on any therapy for this. No follow-ups on file.

## 2019-06-19 ENCOUNTER — Ambulatory Visit (INDEPENDENT_AMBULATORY_CARE_PROVIDER_SITE_OTHER): Payer: Medicare Other | Admitting: Podiatry

## 2019-06-19 ENCOUNTER — Other Ambulatory Visit: Payer: Self-pay

## 2019-06-19 ENCOUNTER — Encounter: Payer: Self-pay | Admitting: Podiatry

## 2019-06-19 DIAGNOSIS — E1140 Type 2 diabetes mellitus with diabetic neuropathy, unspecified: Secondary | ICD-10-CM

## 2019-06-19 DIAGNOSIS — M79674 Pain in right toe(s): Secondary | ICD-10-CM

## 2019-06-19 DIAGNOSIS — L853 Xerosis cutis: Secondary | ICD-10-CM | POA: Diagnosis not present

## 2019-06-19 DIAGNOSIS — M79675 Pain in left toe(s): Secondary | ICD-10-CM | POA: Diagnosis not present

## 2019-06-19 DIAGNOSIS — B351 Tinea unguium: Secondary | ICD-10-CM | POA: Diagnosis not present

## 2019-06-19 NOTE — Progress Notes (Signed)
  Subjective:  Patient ID: Shawn Wagner, male    DOB: 1950/03/20,  MRN: [id number]  Chief Complaint  Patient presents with  . Nail Problem    pt is here for routine foot care, pt is also here for a nail trim as well.   69 y.o. male returns for the above complaint.  Patient presents with thickened elongated mycotic dystrophic toenails x10.  Patient is a type II diabetic.  He denies any other acute complaints.  He has not been able to debride himself as it is causing him so much pain.  He has not been able to ambulate properly.  He would like for me to debride the toenails down.  Objective:  There were no vitals filed for this visit. Podiatric Exam: Vascular: dorsalis pedis and posterior tibial pulses are palpable bilateral. Capillary return is immediate. Temperature gradient is WNL. Skin turgor WNL  Sensorium: Normal Semmes Weinstein monofilament test. Normal tactile sensation bilaterally. Nail Exam: Pt has thick disfigured discolored nails with subungual debris noted bilateral entire nail hallux through fifth toenails Ulcer Exam: There is no evidence of ulcer or pre-ulcerative changes or infection. Orthopedic Exam: Muscle tone and strength are WNL. No limitations in general ROM. No crepitus or effusions noted. HAV  B/L.  Hammer toes 2-5  B/L. Skin: No Porokeratosis. No infection or ulcers  Assessment & Plan:  Patient was evaluated and treated and all questions answered.  Onychomycosis with pain  -Nails palliatively debrided as below. -Educated on self-care  Xerosis bilaterally -I explained to the patient the etiology of xerosis and various treatment options were extensively discussed.  I explained to the patient the importance of maintaining moisturization of the skin with application of over-the-counter lotion such as Eucerin or Luciderm.  I have asked the patient to apply this twice a day.  If unable to resolve patient will benefit from prescription lotion.   Procedure: Nail  Debridement Rationale: pain  Type of Debridement: manual, sharp debridement. Instrumentation: Nail nipper, rotary burr. Number of Nails: 10  Procedures and Treatment: Consent by patient was obtained for treatment procedures. The patient understood the discussion of treatment and procedures well. All questions were answered thoroughly reviewed. Debridement of mycotic and hypertrophic toenails, 1 through 5 bilateral and clearing of subungual debris. No ulceration, no infection noted.  Return Visit-Office Procedure: Patient instructed to return to the office for a follow up visit 3 months for continued evaluation and treatment.  Nicholes Rough, DPM    No follow-ups on file.

## 2019-07-10 DIAGNOSIS — L298 Other pruritus: Secondary | ICD-10-CM | POA: Insufficient documentation

## 2019-07-17 DIAGNOSIS — F458 Other somatoform disorders: Secondary | ICD-10-CM | POA: Insufficient documentation

## 2019-07-25 DIAGNOSIS — M899 Disorder of bone, unspecified: Secondary | ICD-10-CM | POA: Insufficient documentation

## 2019-07-25 DIAGNOSIS — G894 Chronic pain syndrome: Secondary | ICD-10-CM | POA: Insufficient documentation

## 2019-07-25 DIAGNOSIS — Z789 Other specified health status: Secondary | ICD-10-CM | POA: Insufficient documentation

## 2019-07-25 DIAGNOSIS — Z79899 Other long term (current) drug therapy: Secondary | ICD-10-CM | POA: Insufficient documentation

## 2019-07-25 DIAGNOSIS — Z0000 Encounter for general adult medical examination without abnormal findings: Secondary | ICD-10-CM | POA: Insufficient documentation

## 2019-07-25 NOTE — Progress Notes (Deleted)
No show

## 2019-07-26 ENCOUNTER — Ambulatory Visit: Payer: Self-pay | Admitting: Pain Medicine

## 2019-09-18 ENCOUNTER — Other Ambulatory Visit: Payer: Self-pay

## 2019-09-18 ENCOUNTER — Ambulatory Visit: Payer: Medicare Other | Admitting: Podiatry

## 2019-09-18 ENCOUNTER — Encounter: Payer: Self-pay | Admitting: Podiatry

## 2019-09-18 DIAGNOSIS — M722 Plantar fascial fibromatosis: Secondary | ICD-10-CM | POA: Diagnosis not present

## 2019-09-18 DIAGNOSIS — L600 Ingrowing nail: Secondary | ICD-10-CM | POA: Diagnosis not present

## 2019-09-18 DIAGNOSIS — L603 Nail dystrophy: Secondary | ICD-10-CM

## 2019-09-18 NOTE — Progress Notes (Signed)
Subjective:  Patient ID: Shawn Wagner, male    DOB: [birth year],  MRN: [id number]  Chief Complaint  Patient presents with  . Nail Problem    RFC, plantar fibroma left foot   He request injection today  . Foot Pain    69 y.o. male presents with the above complaint.  Patient presents with complaint of right hallux lateral ingrown that has been hurting him for quite some time.  Patient would like to have removed.  He has had it done in the past to a different toe which is helped considerably.  He denies any other acute complaints.  He tried self debridement but it did did not help.  He also has secondary complaint of left plantar fibroma for which she receives injections by Dr. Milinda Pointer.  He would like to know if he can get another injection done as it gives him 1 year of relief after the injection.  He denies any other acute complaints.   Review of Systems: Negative except as noted in the HPI. Denies N/V/F/Ch.  Past Medical History:  Diagnosis Date  . Diabetes mellitus without complication ([place])     Current Outpatient Medications:  .  aspirin 81 MG EC tablet, Take by mouth., Disp: , Rfl:  .  atorvastatin (LIPITOR) 80 MG tablet, Take by mouth., Disp: , Rfl:  .  blood glucose meter kit and supplies KIT, Dispense based on patient and insurance preference. Use up to four times daily as directed. (FOR ICD-9 250.00, 250.01)., Disp: 1 each, Rfl: 0 .  diclofenac Sodium (VOLTAREN) 1 % GEL, Apply topically., Disp: , Rfl:  .  doxycycline (VIBRA-TABS) 100 MG tablet, Take 1 tablet (100 mg total) by mouth 2 (two) times daily., Disp: 20 tablet, Rfl: 0 .  DULoxetine (CYMBALTA) 30 MG capsule, Take by mouth., Disp: , Rfl:  .  FLUoxetine (PROZAC) 10 MG capsule, Take 10 mg by mouth., Disp: , Rfl:  .  fluticasone (FLONASE) 50 MCG/ACT nasal spray, , Disp: , Rfl:  .  gabapentin (NEURONTIN) 300 MG capsule, Take 300 mg by mouth., Disp: , Rfl:  .  glipiZIDE (GLUCOTROL) 5 MG tablet, Take 5 mg by mouth., Disp: ,  Rfl:  .  glucose blood (ACCU-CHEK GUIDE) test strip, by Other route Three (3) times a day before meals. Accu-chek Guide. Dx. E11.65., Disp: , Rfl:  .  insulin aspart (NOVOLOG) 100 UNIT/ML injection, Inject into the skin., Disp: , Rfl:  .  insulin NPH Human (HUMULIN N,NOVOLIN N) 100 UNIT/ML injection, Inject into the skin., Disp: , Rfl:  .  insulin NPH-regular Human (NOVOLIN 70/30) (70-30) 100 UNIT/ML injection, Take 30 units in the morning before breakfast and 30 units in the evening before dinner., Disp: , Rfl:  .  lisinopril (PRINIVIL,ZESTRIL) 2.5 MG tablet, Take by mouth., Disp: , Rfl:  .  loratadine (CLARITIN) 10 MG tablet, Take by mouth., Disp: , Rfl:  .  losartan (COZAAR) 25 MG tablet, Take 25 mg by mouth daily., Disp: , Rfl:  .  metFORMIN (GLUCOPHAGE) 1000 MG tablet, Take 1 tablet (1,000 mg total) by mouth 2 (two) times daily with a meal., Disp: 60 tablet, Rfl: 0 .  metoprolol tartrate (LOPRESSOR) 25 MG tablet, Take by mouth., Disp: , Rfl:  .  omega-3 acid ethyl esters (LOVAZA) 1 g capsule, Take by mouth., Disp: , Rfl:  .  Omega-3 Fatty Acids (FISH OIL) 1000 MG CAPS, Take by mouth., Disp: , Rfl:  .  omeprazole (PRILOSEC) 20 MG capsule, , Disp: ,  Rfl:  .  terbinafine (LAMISIL) 250 MG tablet, , Disp: , Rfl:  .  traZODone (DESYREL) 50 MG tablet, , Disp: , Rfl:   Social History   Tobacco Use  Smoking Status Never Smoker  Smokeless Tobacco Never Used    Allergies  Allergen Reactions  . Vicodin [Hydrocodone-Acetaminophen]     Night sweats   Objective:  There were no vitals filed for this visit. There is no height or weight on file to calculate BMI. Constitutional Well developed. Well nourished.  Vascular Dorsalis pedis pulses palpable bilaterally. Posterior tibial pulses palpable bilaterally. Capillary refill normal to all digits.  No cyanosis or clubbing noted. Pedal hair growth normal.  Neurologic Normal speech. Oriented to person, place, and time. Epicritic sensation to  light touch grossly present bilaterally.  Dermatologic Painful ingrowing nail at lateral nail borders of the hallux nail left. No other open wounds. No skin lesions.  Orthopedic: Normal joint ROM without pain or crepitus bilaterally. No visible deformities. No bony tenderness.   Radiographs: None Assessment:   1. Plantar fascial fibromatosis of left foot   2. Ingrown toenail of right foot   3. Nail dystrophy    Plan:  Patient was evaluated and treated and all questions answered.  Ingrown Nail, left/nail dystrophy -Patient elects to proceed with minor surgery to remove ingrown toenail removal today. Consent reviewed and signed by patient. -Ingrown nail excised. See procedure note. -Educated on post-procedure care including soaking. Written instructions provided and reviewed. -Patient to follow up in 2 weeks for nail check.  Left plantar fibroma -I explained to the patient the etiology of left upper fibroma and various treatment options were discussed.  Patient already wears custom-made orthotics which is helping him a lot to take the stress off.  Given the amount of pain that is having I believe he will benefit from a steroid injection.  Patient would like to proceed with a steroid injection -A steroid injection was performed at left plantar fibroma using 1% plain Lidocaine and 10 mg of Kenalog. This was well tolerated.   Procedure: Excision of Ingrown Toenail Location: Right 1st toe lateral nail borders. Anesthesia: Lidocaine 1% plain; 1.5 mL and Marcaine 0.5% plain; 1.5 mL, digital block. Skin Prep: Betadine. Dressing: Silvadene; telfa; dry, sterile, compression dressing. Technique: Following skin prep, the toe was exsanguinated and a tourniquet was secured at the base of the toe. The affected nail border was freed, split with a nail splitter, and excised. Chemical matrixectomy was then performed with phenol and irrigated out with alcohol. The tourniquet was then removed and  sterile dressing applied. Disposition: Patient tolerated procedure well. Patient to return in 2 weeks for follow-up.   No follow-ups on file.

## 2020-02-21 ENCOUNTER — Encounter: Payer: Self-pay | Admitting: Podiatry

## 2020-02-21 ENCOUNTER — Other Ambulatory Visit: Payer: Self-pay

## 2020-02-21 ENCOUNTER — Ambulatory Visit: Payer: Medicare Other | Admitting: Podiatry

## 2020-02-21 DIAGNOSIS — L600 Ingrowing nail: Secondary | ICD-10-CM | POA: Diagnosis not present

## 2020-02-21 DIAGNOSIS — L603 Nail dystrophy: Secondary | ICD-10-CM

## 2020-02-21 NOTE — Progress Notes (Signed)
Subjective:  Patient ID: Shawn Wagner, male    DOB: [birth year],  MRN: [id number]  Chief Complaint  Patient presents with  . Ingrown Toenail    Patient presents today for recurrent ingrown toenail right hallux    69 y.o. male presents with the above complaint.  Patient presents with thickened dystrophic nail to the right hallux that is very painful to touch.  Patient would like to have the entire nail removed.  She has not he has been dealing with this ingrown for many times.  He denies any other acute complaints.  He is a diabetic with last A1c of 8.0 he denies any other acute complaints   Review of Systems: Negative except as noted in the HPI. Denies N/V/F/Ch.  Past Medical History:  Diagnosis Date  . Diabetes mellitus without complication ([place])     Current Outpatient Medications:  .  aspirin 81 MG EC tablet, Take by mouth., Disp: , Rfl:  .  atorvastatin (LIPITOR) 80 MG tablet, Take by mouth., Disp: , Rfl:  .  blood glucose meter kit and supplies KIT, Dispense based on patient and insurance preference. Use up to four times daily as directed. (FOR ICD-9 250.00, 250.01)., Disp: 1 each, Rfl: 0 .  diclofenac Sodium (VOLTAREN) 1 % GEL, Apply topically., Disp: , Rfl:  .  doxycycline (VIBRA-TABS) 100 MG tablet, Take 1 tablet (100 mg total) by mouth 2 (two) times daily., Disp: 20 tablet, Rfl: 0 .  DULoxetine (CYMBALTA) 30 MG capsule, Take by mouth., Disp: , Rfl:  .  FLUoxetine (PROZAC) 10 MG capsule, Take 10 mg by mouth., Disp: , Rfl:  .  fluticasone (FLONASE) 50 MCG/ACT nasal spray, , Disp: , Rfl:  .  gabapentin (NEURONTIN) 300 MG capsule, Take 300 mg by mouth., Disp: , Rfl:  .  glipiZIDE (GLUCOTROL) 5 MG tablet, Take 5 mg by mouth., Disp: , Rfl:  .  glucose blood (ACCU-CHEK GUIDE) test strip, by Other route Three (3) times a day before meals. Accu-chek Guide. Dx. E11.65., Disp: , Rfl:  .  insulin aspart (NOVOLOG) 100 UNIT/ML injection, Inject into the skin., Disp: , Rfl:  .  insulin  NPH Human (HUMULIN N,NOVOLIN N) 100 UNIT/ML injection, Inject into the skin., Disp: , Rfl:  .  insulin NPH-regular Human (NOVOLIN 70/30) (70-30) 100 UNIT/ML injection, Take 30 units in the morning before breakfast and 30 units in the evening before dinner., Disp: , Rfl:  .  lisinopril (PRINIVIL,ZESTRIL) 2.5 MG tablet, Take by mouth., Disp: , Rfl:  .  loratadine (CLARITIN) 10 MG tablet, Take by mouth., Disp: , Rfl:  .  losartan (COZAAR) 25 MG tablet, Take 25 mg by mouth daily., Disp: , Rfl:  .  metFORMIN (GLUCOPHAGE) 1000 MG tablet, Take 1 tablet (1,000 mg total) by mouth 2 (two) times daily with a meal., Disp: 60 tablet, Rfl: 0 .  metoprolol tartrate (LOPRESSOR) 25 MG tablet, Take by mouth., Disp: , Rfl:  .  omega-3 acid ethyl esters (LOVAZA) 1 g capsule, Take by mouth., Disp: , Rfl:  .  omeprazole (PRILOSEC) 20 MG capsule, , Disp: , Rfl:  .  terbinafine (LAMISIL) 250 MG tablet, , Disp: , Rfl:  .  traZODone (DESYREL) 50 MG tablet, , Disp: , Rfl:   Social History   Tobacco Use  Smoking Status Never Smoker  Smokeless Tobacco Never Used    Allergies  Allergen Reactions  . Vicodin [Hydrocodone-Acetaminophen]     Night sweats   Objective:  There were no vitals filed  for this visit. There is no height or weight on file to calculate BMI. Constitutional Well developed. Well nourished.  Vascular Dorsalis pedis pulses palpable bilaterally. Posterior tibial pulses palpable bilaterally. Capillary refill normal to all digits.  No cyanosis or clubbing noted. Pedal hair growth normal.  Neurologic Normal speech. Oriented to person, place, and time. Epicritic sensation to light touch grossly present bilaterally.  Dermatologic Pain on palpation of the entire/total nail on 1st digit of the left No other open wounds. No skin lesions.  Orthopedic: Normal joint ROM without pain or crepitus bilaterally. No visible deformities. No bony tenderness.   Radiographs: None Assessment:   1. Nail  dystrophy   2. Ingrown toenail of right foot    Plan:  Patient was evaluated and treated and all questions answered.  Nail contusion/dystrophy hallux, left -Patient elects to proceed with minor surgery to remove entire toenail today. Consent reviewed and signed by patient. -Entire/total nail excised. See procedure note. -Educated on post-procedure care including soaking. Written instructions provided and reviewed. -Patient to follow up in 2 weeks for nail check. -I discussed with the patient extensive detail given that he is diabetic with uncontrolled A1c he is a high risk of developing infection as well as losing the toe or the foot.  I discussed this with extensive detail.  However he does have flow to the lower extremity as well as previous ingrown procedures that he has healed and I do feel little comfortable proceeding.  Patient agrees with the plan would like to proceed with total nail avulsion as well.  -Doxycycline was dispensed for skin and soft tissue prophylaxis Procedure: Excision of entire/total nail  Location: Left 1st toe digit Anesthesia: Lidocaine 1% plain; 1.5 mL and Marcaine 0.5% plain; 1.5 mL, digital block. Skin Prep: Betadine. Dressing: Silvadene; telfa; dry, sterile, compression dressing. Technique: Following skin prep, the toe was exsanguinated and a tourniquet was secured at the base of the toe. The affected nail border was freed and excised.  Phenol matricectomy was performed.  The tourniquet was then removed and sterile dressing applied. Disposition: Patient tolerated procedure well. Patient to return in 2 weeks for follow-up.   No follow-ups on file.

## 2020-02-21 NOTE — Patient Instructions (Signed)

## 2020-05-22 ENCOUNTER — Ambulatory Visit: Payer: Medicare Other | Admitting: Podiatry

## 2020-05-29 ENCOUNTER — Other Ambulatory Visit: Payer: Self-pay

## 2020-05-29 ENCOUNTER — Ambulatory Visit (INDEPENDENT_AMBULATORY_CARE_PROVIDER_SITE_OTHER): Payer: HMO | Admitting: Podiatry

## 2020-05-29 ENCOUNTER — Encounter: Payer: Self-pay | Admitting: Podiatry

## 2020-05-29 DIAGNOSIS — L603 Nail dystrophy: Secondary | ICD-10-CM

## 2020-05-29 DIAGNOSIS — T148XXA Other injury of unspecified body region, initial encounter: Secondary | ICD-10-CM | POA: Diagnosis not present

## 2020-05-29 NOTE — Progress Notes (Signed)
Subjective:  Patient ID: Shawn Wagner, male    DOB: 04-14-1950,  MRN: [id number]  Chief Complaint  Patient presents with  . Nail Problem    Nail trim [facility]    70 y.o. male presents with the above complaint.  Patient presents with complaint of hematoma underneath the left hallux after dropping a can of beans on top of the toe.  Patient states he does not have much pain because of neuropathy.  But given that his diabetic without much hematoma he would like to have removed.  He denies any other acute complaints.  He has only noticed some mild pain.  He denies any other acute complaints.   Review of Systems: Negative except as noted in the HPI. Denies N/V/F/Ch.  Past Medical History:  Diagnosis Date  . Diabetes mellitus without complication ([place])     Current Outpatient Medications:  .  aspirin 81 MG EC tablet, Take by mouth., Disp: , Rfl:  .  atorvastatin (LIPITOR) 80 MG tablet, Take by mouth., Disp: , Rfl:  .  blood glucose meter kit and supplies KIT, Dispense based on patient and insurance preference. Use up to four times daily as directed. (FOR ICD-9 250.00, 250.01)., Disp: 1 each, Rfl: 0 .  doxycycline (VIBRA-TABS) 100 MG tablet, Take 1 tablet (100 mg total) by mouth 2 (two) times daily., Disp: 20 tablet, Rfl: 0 .  DULoxetine (CYMBALTA) 30 MG capsule, Take by mouth., Disp: , Rfl:  .  FLUoxetine (PROZAC) 10 MG capsule, Take 10 mg by mouth., Disp: , Rfl:  .  fluticasone (FLONASE) 50 MCG/ACT nasal spray, , Disp: , Rfl:  .  gabapentin (NEURONTIN) 300 MG capsule, Take 300 mg by mouth., Disp: , Rfl:  .  glipiZIDE (GLUCOTROL) 5 MG tablet, Take 5 mg by mouth., Disp: , Rfl:  .  glucose blood (ACCU-CHEK GUIDE) test strip, by Other route Three (3) times a day before meals. Accu-chek Guide. Dx. E11.65., Disp: , Rfl:  .  insulin aspart (NOVOLOG) 100 UNIT/ML injection, Inject into the skin., Disp: , Rfl:  .  insulin NPH Human (HUMULIN N,NOVOLIN N) 100 UNIT/ML injection, Inject into the skin.,  Disp: , Rfl:  .  insulin NPH-regular Human (NOVOLIN 70/30) (70-30) 100 UNIT/ML injection, Take 30 units in the morning before breakfast and 30 units in the evening before dinner., Disp: , Rfl:  .  lisinopril (PRINIVIL,ZESTRIL) 2.5 MG tablet, Take by mouth., Disp: , Rfl:  .  loratadine (CLARITIN) 10 MG tablet, Take by mouth., Disp: , Rfl:  .  losartan (COZAAR) 25 MG tablet, Take 25 mg by mouth daily., Disp: , Rfl:  .  metFORMIN (GLUCOPHAGE) 1000 MG tablet, Take 1 tablet (1,000 mg total) by mouth 2 (two) times daily with a meal., Disp: 60 tablet, Rfl: 0 .  metoprolol tartrate (LOPRESSOR) 25 MG tablet, Take by mouth., Disp: , Rfl:  .  omega-3 acid ethyl esters (LOVAZA) 1 g capsule, Take by mouth., Disp: , Rfl:  .  omeprazole (PRILOSEC) 20 MG capsule, , Disp: , Rfl:  .  terbinafine (LAMISIL) 250 MG tablet, , Disp: , Rfl:  .  traZODone (DESYREL) 50 MG tablet, , Disp: , Rfl:   Social History   Tobacco Use  Smoking Status Never Smoker  Smokeless Tobacco Never Used    Allergies  Allergen Reactions  . Vicodin [Hydrocodone-Acetaminophen]     Night sweats   Objective:  There were no vitals filed for this visit. There is no height or weight on file to calculate  BMI. Constitutional Well developed. Well nourished.  Vascular Dorsalis pedis pulses palpable bilaterally. Posterior tibial pulses palpable bilaterally. Capillary refill normal to all digits.  No cyanosis or clubbing noted. Pedal hair growth normal.  Neurologic Normal speech. Oriented to person, place, and time. Epicritic sensation to light touch grossly present bilaterally.  Dermatologic Pain on palpation of the entire/total nail on 1st digit of the left.  Hematoma noted under the nail. No other open wounds. No skin lesions.  Orthopedic: Normal joint ROM without pain or crepitus bilaterally. No visible deformities. No bony tenderness.   Radiographs: None Assessment:   1. Hematoma and contusion   2. Nail dystrophy    Plan:   Patient was evaluated and treated and all questions answered.  Nail contusion/dystrophy hallux, left with underlying nail hematoma secondary to contusion/trauma -Patient elects to proceed with minor surgery to remove entire toenail today. Consent reviewed and signed by patient. -Entire/total nail excised. See procedure note. -Educated on post-procedure care including soaking. Written instructions provided and reviewed. -Patient to follow up in 2 weeks for nail check.  Procedure: Excision of entire/total nail  Location: Left 1st toe digit Anesthesia: Lidocaine 1% plain; 1.5 mL and Marcaine 0.5% plain; 1.5 mL, digital block. Skin Prep: Betadine. Dressing: Silvadene; telfa; dry, sterile, compression dressing. Technique: Following skin prep, the toe was exsanguinated and a tourniquet was secured at the base of the toe. The affected nail border was freed and excised. The tourniquet was then removed and sterile dressing applied. Disposition: Patient tolerated procedure well. Patient to return in 2 weeks for follow-up.   No follow-ups on file.

## 2020-07-21 ENCOUNTER — Ambulatory Visit: Payer: Self-pay

## 2020-07-22 ENCOUNTER — Other Ambulatory Visit: Payer: Self-pay

## 2020-07-22 ENCOUNTER — Ambulatory Visit: Payer: Self-pay | Admitting: Physician Assistant

## 2020-07-22 DIAGNOSIS — Z113 Encounter for screening for infections with a predominantly sexual mode of transmission: Secondary | ICD-10-CM

## 2020-07-22 DIAGNOSIS — Z202 Contact with and (suspected) exposure to infections with a predominantly sexual mode of transmission: Secondary | ICD-10-CM

## 2020-07-22 LAB — GRAM STAIN

## 2020-07-22 MED ORDER — PENICILLIN G BENZATHINE 1200000 UNIT/2ML IM SUSY
2.4000 10*6.[IU] | PREFILLED_SYRINGE | Freq: Once | INTRAMUSCULAR | Status: AC
  Administered 2020-07-22: 2.4 10*6.[IU] via INTRAMUSCULAR

## 2020-07-22 NOTE — Progress Notes (Signed)
Pt here for STD screening.  Gram Stain results reviewed.  Bicillin 2.4MU given.  Pt monitored for 15 minutes.  Denies any complications. Condoms given.  Berdie Ogren, RN

## 2020-07-24 ENCOUNTER — Encounter: Payer: Self-pay | Admitting: Physician Assistant

## 2020-07-24 NOTE — Progress Notes (Signed)
[facility] Department STI clinic/screening visit  Subjective:  Shawn Wagner is a 70 y.o. male being seen today for an STI screening visit. The patient reports they do not have symptoms.    Patient has the following medical conditions:   Patient Active Problem List   Diagnosis Date Noted  . Chronic pain syndrome 07/25/2019  . Pharmacologic therapy 07/25/2019  . Disorder of skeletal system 07/25/2019  . Problems influencing health status 07/25/2019  . Neurogenic pruritus 07/17/2019  . Chronic pruritic rash in adult 07/10/2019  . Cerebrovascular accident (CVA) (HCC) 03/28/2018  . Altered mental status 03/15/2018  . History of shingles 03/15/2018  . Diabetic neuropathy, painful (HCC) 06/21/2016  . Neurodermatitis 06/21/2016  . Neuropathy 05/24/2016  . Arachnoid cyst 01/21/2016  . Pulmonary nodule 01/21/2016  . Dupuytren's contracture of both hands 06/04/2013  . CAD (coronary artery disease) 08/24/2012  . Sebaceous cyst 06/19/2010  . Depressive disorder 06/07/2010  . Low back pain 06/07/2010  . Lichenification and lichen simplex chronicus 04/29/2010  . Pityriasis versicolor 07/11/2009  . Diabetes mellitus (HCC) 05/31/2008  . Hypercholesteremia 05/31/2008  . Hypertension, benign 05/31/2008     Chief Complaint  Patient presents with  . SEXUALLY TRANSMITTED DISEASE    screening    HPI  Patient reports that he is not having any symptoms but that he was informed that he is a contact to Syphilis.  Denies chronic conditions except for DM.  States that he takes medicine as directed for DM.  States that he has not previously had a HIV test and last void prior to sample collection for Gram stain was about 2 hr ago.   See flowsheet for further details and programmatic requirements.    The following portions of the patient's history were reviewed and updated as appropriate: allergies, current medications, past medical history, past social history, past surgical  history and problem list.  Objective:  There were no vitals filed for this visit.  Physical Exam Constitutional:      General: He is not in acute distress.    Appearance: Normal appearance.  HENT:     Head: Normocephalic and atraumatic.     Comments: No nits,lice, or hair loss. No cervical, supraclavicular or axillary adenopathy.    Mouth/Throat:     Mouth: Mucous membranes are moist.     Pharynx: Oropharynx is clear. No oropharyngeal exudate or posterior oropharyngeal erythema.  Eyes:     Conjunctiva/sclera: Conjunctivae normal.  Pulmonary:     Effort: Pulmonary effort is normal.  Abdominal:     Palpations: Abdomen is soft. There is no mass.     Tenderness: There is no abdominal tenderness. There is no guarding or rebound.  Genitourinary:    Penis: Normal.      Testes: Normal.     Comments: Pubic area without nits, lice, hair loss, edema, erythema, lesions and inguinal adenopathy. Penis circumcised without rash, lesions and discharge at meatus. Testicles descended bilaterally,nt, no masses or edema. Musculoskeletal:     Cervical back: Neck supple. No tenderness.  Skin:    General: Skin is warm and dry.     Findings: No bruising, erythema, lesion or rash.  Neurological:     Mental Status: He is alert and oriented to person, place, and time.  Psychiatric:        Mood and Affect: Mood normal.        Behavior: Behavior normal.        Thought Content: Thought content normal.  Judgment: Judgment normal.       Assessment and Plan:  Shawn Wagner is a 70 y.o. male presenting to the [facility] Department for STI screening  1. Screening for STD (sexually transmitted disease) Patient into clinic without symptoms. Rec condoms with all sex. Await test results.  Counseled that RN will call if needs to RTC for treatment once results are back. - Gram stain - Gonococcus culture - HIV/HCV [facility] - Syphilis Serology, [place] [facility] - Gonococcus  culture  2. Syphilis contact Will treat as a contact to Syphilis with Bicillin 2.4 mu IM today. Counseled that he will be contacted if he needs to RTC for further treatment once results are back.  Reviewed with patient normal SE after injections and to use OTC analgesics and warm compresses as needed. - penicillin g benzathine (BICILLIN LA) 1200000 UNIT/2ML injection 2.4 Million Units     No follow-ups on file.  Future Appointments  Date Time Provider Department Center  09/02/2020  1:15 PM Candelaria Stagers, DPM [id number]    Marylynn Pearson [place], [place]

## 2020-07-25 LAB — HM HEPATITIS C SCREENING LAB: HM Hepatitis Screen: NEGATIVE

## 2020-07-25 LAB — HM HIV SCREENING LAB: HM HIV Screening: NEGATIVE

## 2020-07-26 LAB — GONOCOCCUS CULTURE

## 2020-07-28 ENCOUNTER — Telehealth: Payer: Self-pay

## 2020-07-28 NOTE — Telephone Encounter (Signed)
PC to pt to advise of positive syphilis results. Per C. [place], [place] pt needs to return in , and annually to monitor titers for re-infection. Pt verbalized understanding.

## 2020-09-02 ENCOUNTER — Ambulatory Visit: Payer: Medicare HMO | Admitting: Podiatry

## 2020-09-02 ENCOUNTER — Other Ambulatory Visit: Payer: Self-pay

## 2020-09-02 DIAGNOSIS — B351 Tinea unguium: Secondary | ICD-10-CM | POA: Diagnosis not present

## 2020-09-02 DIAGNOSIS — E1140 Type 2 diabetes mellitus with diabetic neuropathy, unspecified: Secondary | ICD-10-CM | POA: Diagnosis not present

## 2020-09-02 DIAGNOSIS — M79675 Pain in left toe(s): Secondary | ICD-10-CM | POA: Diagnosis not present

## 2020-09-02 DIAGNOSIS — M79674 Pain in right toe(s): Secondary | ICD-10-CM | POA: Diagnosis not present

## 2020-09-03 ENCOUNTER — Encounter: Payer: Self-pay | Admitting: Podiatry

## 2020-09-03 NOTE — Progress Notes (Signed)
  Subjective:  Patient ID: Shawn Wagner, male    DOB: 16-Feb-1951,  MRN: [id number]  Chief Complaint  Patient presents with   Nail Problem    Nail trim    70 y.o. male returns for the above complaint.  Patient presents with thickened elongated mycotic dystrophic toenails x10.  Patient is a type II diabetic.  He denies any other acute complaints.  He has not been able to debride himself as it is causing him so much pain.  He has not been able to ambulate properly.  He would like for me to debride the toenails down.  Objective:  There were no vitals filed for this visit. Podiatric Exam: Vascular: dorsalis pedis and posterior tibial pulses are palpable bilateral. Capillary return is immediate. Temperature gradient is WNL. Skin turgor WNL  Sensorium: Normal Semmes Weinstein monofilament test. Normal tactile sensation bilaterally. Nail Exam: Pt has thick disfigured discolored nails with subungual debris noted bilateral entire nail hallux through fifth toenails Ulcer Exam: There is no evidence of ulcer or pre-ulcerative changes or infection. Orthopedic Exam: Muscle tone and strength are WNL. No limitations in general ROM. No crepitus or effusions noted. HAV  B/L.  Hammer toes 2-5  B/L. Skin: No Porokeratosis. No infection or ulcers  Assessment & Plan:  Patient was evaluated and treated and all questions answered.  Onychomycosis with pain  -Nails palliatively debrided as below. -Educated on self-care  Xerosis bilaterally -I explained to the patient the etiology of xerosis and various treatment options were extensively discussed.  I explained to the patient the importance of maintaining moisturization of the skin with application of over-the-counter lotion such as Eucerin or Luciderm.  I have asked the patient to apply this twice a day.  If unable to resolve patient will benefit from prescription lotion.   Procedure: Nail Debridement Rationale: pain  Type of Debridement: manual, sharp  debridement. Instrumentation: Nail nipper, rotary burr. Number of Nails: 10  Procedures and Treatment: Consent by patient was obtained for treatment procedures. The patient understood the discussion of treatment and procedures well. All questions were answered thoroughly reviewed. Debridement of mycotic and hypertrophic toenails, 1 through 5 bilateral and clearing of subungual debris. No ulceration, no infection noted.  Return Visit-Office Procedure: Patient instructed to return to the office for a follow up visit 3 months for continued evaluation and treatment.  Nicholes Rough, DPM    No follow-ups on file.

## 2020-11-04 ENCOUNTER — Other Ambulatory Visit: Payer: Self-pay | Admitting: Family Medicine

## 2020-11-04 DIAGNOSIS — E1169 Type 2 diabetes mellitus with other specified complication: Secondary | ICD-10-CM

## 2020-11-04 DIAGNOSIS — Z794 Long term (current) use of insulin: Secondary | ICD-10-CM

## 2020-11-04 DIAGNOSIS — N183 Chronic kidney disease, stage 3 unspecified: Secondary | ICD-10-CM | POA: Insufficient documentation

## 2020-11-06 ENCOUNTER — Ambulatory Visit: Payer: Medicare PPO

## 2020-12-04 ENCOUNTER — Ambulatory Visit: Payer: Medicare HMO | Admitting: Podiatry

## 2020-12-18 ENCOUNTER — Ambulatory Visit: Payer: Medicare PPO | Admitting: Podiatry

## 2020-12-18 ENCOUNTER — Encounter (INDEPENDENT_AMBULATORY_CARE_PROVIDER_SITE_OTHER): Payer: Self-pay

## 2020-12-18 ENCOUNTER — Other Ambulatory Visit: Payer: Self-pay

## 2020-12-18 DIAGNOSIS — M79674 Pain in right toe(s): Secondary | ICD-10-CM | POA: Diagnosis not present

## 2020-12-18 DIAGNOSIS — E1140 Type 2 diabetes mellitus with diabetic neuropathy, unspecified: Secondary | ICD-10-CM

## 2020-12-18 DIAGNOSIS — M79675 Pain in left toe(s): Secondary | ICD-10-CM | POA: Diagnosis not present

## 2020-12-18 DIAGNOSIS — B351 Tinea unguium: Secondary | ICD-10-CM

## 2020-12-23 ENCOUNTER — Encounter: Payer: Self-pay | Admitting: Podiatry

## 2020-12-23 NOTE — Progress Notes (Signed)
  Subjective:  Patient ID: Shawn Wagner, male    DOB: 03/06/1951,  MRN: [id number]  Chief Complaint  Patient presents with   Nail Problem    Nail trim    70 y.o. male returns for the above complaint.  Patient presents with thickened elongated mycotic dystrophic toenails x10.  Patient is a type II diabetic.  He denies any other acute complaints.  He has not been able to debride himself as it is causing him so much pain.  He has not been able to ambulate properly.  He would like for me to debride the toenails down.  Objective:  There were no vitals filed for this visit. Podiatric Exam: Vascular: dorsalis pedis and posterior tibial pulses are palpable bilateral. Capillary return is immediate. Temperature gradient is WNL. Skin turgor WNL  Sensorium: Normal Semmes Weinstein monofilament test. Normal tactile sensation bilaterally. Nail Exam: Pt has thick disfigured discolored nails with subungual debris noted bilateral entire nail hallux through fifth toenails Ulcer Exam: There is no evidence of ulcer or pre-ulcerative changes or infection. Orthopedic Exam: Muscle tone and strength are WNL. No limitations in general ROM. No crepitus or effusions noted. HAV  B/L.  Hammer toes 2-5  B/L. Skin: No Porokeratosis. No infection or ulcers  Assessment & Plan:  Patient was evaluated and treated and all questions answered.  Onychomycosis with pain  -Nails palliatively debrided as below. -Educated on self-care  Xerosis bilaterally -I explained to the patient the etiology of xerosis and various treatment options were extensively discussed.  I explained to the patient the importance of maintaining moisturization of the skin with application of over-the-counter lotion such as Eucerin or Luciderm.  I have asked the patient to apply this twice a day.  If unable to resolve patient will benefit from prescription lotion.   Procedure: Nail Debridement Rationale: pain  Type of Debridement: manual, sharp  debridement. Instrumentation: Nail nipper, rotary burr. Number of Nails: 10  Procedures and Treatment: Consent by patient was obtained for treatment procedures. The patient understood the discussion of treatment and procedures well. All questions were answered thoroughly reviewed. Debridement of mycotic and hypertrophic toenails, 1 through 5 bilateral and clearing of subungual debris. No ulceration, no infection noted.  Return Visit-Office Procedure: Patient instructed to return to the office for a follow up visit 3 months for continued evaluation and treatment.  Nicholes Rough, DPM    No follow-ups on file.

## 2021-03-24 ENCOUNTER — Ambulatory Visit: Payer: Medicare PPO | Admitting: Podiatry

## 2021-06-15 DIAGNOSIS — H2512 Age-related nuclear cataract, left eye: Secondary | ICD-10-CM | POA: Insufficient documentation

## 2021-06-15 DIAGNOSIS — H43813 Vitreous degeneration, bilateral: Secondary | ICD-10-CM | POA: Insufficient documentation

## 2021-06-15 DIAGNOSIS — E113293 Type 2 diabetes mellitus with mild nonproliferative diabetic retinopathy without macular edema, bilateral: Secondary | ICD-10-CM | POA: Insufficient documentation

## 2021-09-16 DIAGNOSIS — M542 Cervicalgia: Secondary | ICD-10-CM | POA: Insufficient documentation

## 2021-11-15 DIAGNOSIS — E875 Hyperkalemia: Secondary | ICD-10-CM | POA: Insufficient documentation

## 2021-11-15 DIAGNOSIS — S91331A Puncture wound without foreign body, right foot, initial encounter: Secondary | ICD-10-CM | POA: Insufficient documentation

## 2021-11-15 DIAGNOSIS — W57XXXA Bitten or stung by nonvenomous insect and other nonvenomous arthropods, initial encounter: Secondary | ICD-10-CM | POA: Insufficient documentation

## 2021-11-15 DIAGNOSIS — D509 Iron deficiency anemia, unspecified: Secondary | ICD-10-CM | POA: Insufficient documentation

## 2021-11-15 DIAGNOSIS — Z609 Problem related to social environment, unspecified: Secondary | ICD-10-CM | POA: Insufficient documentation

## 2022-03-08 DIAGNOSIS — L03312 Cellulitis of back [any part except buttock]: Secondary | ICD-10-CM | POA: Insufficient documentation

## 2022-03-08 DIAGNOSIS — B338 Other specified viral diseases: Secondary | ICD-10-CM | POA: Insufficient documentation

## 2022-03-08 DIAGNOSIS — J189 Pneumonia, unspecified organism: Secondary | ICD-10-CM | POA: Insufficient documentation

## 2022-03-30 DIAGNOSIS — A5271 Late syphilitic oculopathy: Secondary | ICD-10-CM | POA: Insufficient documentation

## 2022-03-30 DIAGNOSIS — I330 Acute and subacute infective endocarditis: Secondary | ICD-10-CM | POA: Insufficient documentation

## 2022-04-23 DIAGNOSIS — K5641 Fecal impaction: Secondary | ICD-10-CM | POA: Insufficient documentation

## 2022-05-13 DIAGNOSIS — I340 Nonrheumatic mitral (valve) insufficiency: Secondary | ICD-10-CM | POA: Insufficient documentation

## 2022-05-31 ENCOUNTER — Ambulatory Visit: Payer: Medicare PPO | Admitting: Family

## 2022-05-31 ENCOUNTER — Encounter: Payer: Self-pay | Admitting: Family

## 2022-05-31 DIAGNOSIS — Z113 Encounter for screening for infections with a predominantly sexual mode of transmission: Secondary | ICD-10-CM

## 2022-05-31 LAB — HM HIV SCREENING LAB: HM HIV Screening: NEGATIVE

## 2022-05-31 NOTE — Progress Notes (Signed)
[facility] Department STI clinic/screening visit  Subjective:  Shawn Wagner is a 72 y.o. male being seen today for an STI screening visit. The patient reports they do not have symptoms.    Patient has the following medical conditions:   Patient Active Problem List   Diagnosis Date Noted   Chronic pain syndrome 07/25/2019   Pharmacologic therapy 07/25/2019   Disorder of skeletal system 07/25/2019   Problems influencing health status 07/25/2019   Neurogenic pruritus 07/17/2019   Chronic pruritic rash in adult 07/10/2019   Cerebrovascular accident (CVA) ([place]) 03/28/2018   Altered mental status 03/15/2018   History of shingles 03/15/2018   Diabetic neuropathy, painful ([place]) 06/21/2016   Neurodermatitis 06/21/2016   Neuropathy 05/24/2016   Arachnoid cyst 01/21/2016   Pulmonary nodule 01/21/2016   Dupuytren's contracture of both hands 06/04/2013   CAD (coronary artery disease) 08/24/2012   Sebaceous cyst 06/19/2010   Depressive disorder 06/07/2010   Low back pain 99991111   Lichenification and lichen simplex chronicus 04/29/2010   Pityriasis versicolor 07/11/2009   Diabetes mellitus ([place]) 05/31/2008   Hypercholesteremia 05/31/2008   Hypertension, benign 05/31/2008     Chief Complaint  Patient presents with   SEXUALLY TRANSMITTED DISEASE    Screening    HPI  Patient reports his doctor advised him to come here for "additional course of IM Bicillin", as a treatment booster for past syphilis infection. RN spoke with [place] who informed that patient just needs bloodwork to check syphilis titers. Patient agrees to screening without examination.   Last HIV test per patient/review of record was  Lab Results  Component Value Date   HMHIVSCREEN Negative - Validated 07/25/2020   No results found for: "HIV"  Does the patient or their partner desires a pregnancy in the next year? No  Screening for MPX risk: Does the patient have an unexplained rash?  No Is the patient MSM? No Does the patient endorse multiple sex partners or anonymous sex partners? No Did the patient have close or sexual contact with a person diagnosed with MPX? No Has the patient traveled outside the [place] where MPX is endemic? No Is there a high clinical suspicion for MPX-- evidenced by one of the following No  -Unlikely to be chickenpox  -Lymphadenopathy  -Rash that present in same phase of evolution on any given body part   See flowsheet for further details and programmatic requirements.   Immunization History  Administered Date(s) Administered   Influenza, Seasonal, Injecte, Preservative Fre 12/29/2005   Pneumococcal Conjugate-13 02/21/2014   Pneumococcal Polysaccharide-23 12/14/2007, 03/07/2017   Tdap 11/14/2021     The following portions of the patient's history were reviewed and updated as appropriate: allergies, current medications, past medical history, past social history, past surgical history and problem list.  Objective:  There were no vitals filed for this visit.  Physical Exam Patient declined physical examination   Assessment and Plan:  Shawn Wagner is a 72 y.o. male presenting to the [facility] Department for STI screening  1. Screening for venereal disease STI bloodwork performed Unable to give urine sample for GC/CT testing Will contact with results Use condoms for all sex  - Syphilis Serology, [facility] - [facility] [place] [facility]   Patient does not have STI symptoms Patient accepted all screenings including  urine GC/Chlamydia, and blood work for HIV/Syphilis. Patient meets criteria for HepB screening? No. Ordered? no Patient meets criteria for HepC screening? No. Ordered? no Recommended condom use  with all sex Discussed importance of condom use for STI prevent  Treat positive test results per standing order. Discussed time line for [facility] results and that patient will be called with positive results and encouraged  patient to call if he had not heard in 2 weeks Recommended repeat testing in 3 months with positive results. Recommended returning for continued or worsening symptoms.   Return if symptoms worsen or fail to improve.  Future Appointments  Date Time Provider [place]  06/23/2022  1:30 PM Valinda Party, DO [facility] None    Marline Backbone, FNP

## 2022-05-31 NOTE — Progress Notes (Signed)
Pt is here for STD screening and Syphilis monitoring.  Condoms given.  Windle Guard, RN

## 2022-06-23 ENCOUNTER — Encounter: Payer: Medicare PPO | Attending: Internal Medicine | Admitting: Internal Medicine

## 2022-06-23 DIAGNOSIS — Z8614 Personal history of Methicillin resistant Staphylococcus aureus infection: Secondary | ICD-10-CM | POA: Insufficient documentation

## 2022-06-23 DIAGNOSIS — L97812 Non-pressure chronic ulcer of other part of right lower leg with fat layer exposed: Secondary | ICD-10-CM | POA: Diagnosis not present

## 2022-06-23 DIAGNOSIS — E11622 Type 2 diabetes mellitus with other skin ulcer: Secondary | ICD-10-CM | POA: Diagnosis not present

## 2022-06-23 DIAGNOSIS — I87311 Chronic venous hypertension (idiopathic) with ulcer of right lower extremity: Secondary | ICD-10-CM | POA: Insufficient documentation

## 2022-06-24 NOTE — Progress Notes (Signed)
Shawn Wagner ([id number]) [id number] Page 1 of 4 Visit Report for 06/23/2022 Abuse Risk Screen Details Patient Name: Date of Service: Shawn Wagner MES Wagner. 06/23/2022 1:30 PM Medical Record Number: [id number] Patient Account Number: [id number] Date of Birth/Sex: Treating RN: 31-Oct-1950 (72 y.o. Shawn Wagner) Shawn Wagner Primary Care Tamica Covell: Cleon Dew Other Clinician: Referring Amea Mcphail: Treating Jais Demir/Extender: Tori Milks in Treatment: 0 Abuse Risk Screen Items Answer ABUSE RISK SCREEN: Has anyone close to you tried to hurt or harm you recentlyo No Do you feel uncomfortable with anyone in your familyo No Has anyone forced you do things that you didnt want to doo No Electronic Signature(s) Signed: 06/23/2022 4:41:51 PM By: Shawn Pax RN Entered By: Shawn Wagner on 06/23/2022 13:40:08 -------------------------------------------------------------------------------- Activities of Daily Living Details Patient Name: Date of Service: Shawn Wagner MES Wagner. 06/23/2022 1:30 PM Medical Record Number: [id number] Patient Account Number: [id number] Date of Birth/Sex: Treating RN: 03-22-1950 (71 y.o. Shawn Wagner) Shawn Wagner Primary Care Manual [place]: Cleon Dew Other Clinician: Referring Tabria Steines: Treating Dalphine Cowie/Extender: Tori Milks in Treatment: 0 Activities of Daily Living Items Answer Activities of Daily Living (Please select one for each item) Drive Automobile Completely Able T Medications ake Completely Able Use T elephone Completely Able Care for Appearance Completely Able Use T oilet Completely Able Bath / Shower Completely Able Dress Self Completely Able Feed Self Completely Able Walk Completely Able Get In / Out Bed Completely Able Housework Completely Able Prepare Meals Completely Able Handle Money Completely Able Shop for Self Completely Able Electronic Signature(s) Signed:  06/23/2022 4:41:51 PM By: Shawn Pax RN Entered By: Shawn Wagner on 06/23/2022 13:40:28 -------------------------------------------------------------------------------- Education Screening Details Patient Name: Date of Service: Shawn Sheela Stack, Shawn Wagner MES Wagner. 06/23/2022 1:30 PM Medical Record Number: [id number] Patient Account Number: [id number] Date of Birth/Sex: Treating RN: [birth year] (71 y.o. Shawn Wagner Primary Care Shawn Wagner: Cleon Dew Other Clinician: Referring June Vacha: Treating Shawn Wagner/Extender: Tori Milks in Treatment: 0 CZAR, YSAGUIRRE ([id number]) Shawn Wagner [id number].pdf Page 2 of 4 Primary Learner Assessed: Patient Learning Preferences/Education Level/Primary Language Learning Preference: Explanation Highest Education Level: College or Above Preferred Language: English Cognitive Barrier Language Barrier: No Translator Needed: No Memory Deficit: No Emotional Barrier: No Cultural/Religious Beliefs Affecting Medical Care: No Physical Barrier Impaired Vision: Yes Glasses Impaired Hearing: No Decreased Hand dexterity: No Knowledge/Comprehension Knowledge Level: Medium Comprehension Level: Medium Ability to understand written instructions: Medium Ability to understand verbal instructions: Medium Motivation Anxiety Level: Anxious Cooperation: Cooperative Education Importance: Acknowledges Need Interest in Health Problems: Asks Questions Perception: Coherent Willingness to Engage in Self-Management High Activities: Readiness to Engage in Self-Management High Activities: Electronic Signature(s) Signed: 06/23/2022 4:41:51 PM By: Shawn Pax RN Entered By: Shawn Wagner on 06/23/2022 13:41:12 -------------------------------------------------------------------------------- Fall Risk Assessment Details Patient Name: Date of Service: Shawn Sheela Stack, JA MES Wagner. 06/23/2022 1:30 PM Medical Record Number: [id number] Patient  Account Number: [id number] Date of Birth/Sex: Treating RN: 04-Nov-1950 (71 y.o. Shawn Wagner) Shawn Wagner Primary Care Aycen Porreca: Cleon Dew Other Clinician: Referring Furqan Gosselin: Treating Czarina Gingras/Extender: Tori Milks in Treatment: 0 Fall Risk Assessment Items Have you had 2 or more falls in the last 12 monthso 0 No Have you had any fall that resulted in injury in the last 12 monthso 0 No FALLS RISK SCREEN History of falling - immediate or within 3 months 0 No Secondary diagnosis (Do you have 2 or more medical diagnoseso) 0 No Ambulatory aid None/bed rest/wheelchair/nurse 0 Yes Crutches/cane/walker 0 No  Furniture 0 No Intravenous therapy Access/Saline/Heparin Lock 0 No Gait/Transferring Normal/ bed rest/ wheelchair 0 Yes Weak (short steps with or without shuffle, stooped but able to lift head while walking, may seek 0 No support from furniture) Impaired (short steps with shuffle, may have difficulty arising from chair, head down, impaired 0 No balance) Mental Status Oriented to own ability 0 Yes Overestimates or forgets limitations 0 No Risk Level: Low Risk Score: 0 TYLAN, KINN ([id number]) [id number] Page 3 of 4 Electronic Signature(s) -------------------------------------------------------------------------------- Foot Assessment Details Patient Name: Date of Service: Shawn Wagner MES Wagner. 06/23/2022 1:30 PM Medical Record Number: [id number] Patient Account Number: [id number] Date of Birth/Sex: Treating RN: Feb 01, 1951 (71 y.o. Shawn Wagner) Shawn Wagner Primary Care Shawn Wagner: Cleon Dew Other Clinician: Referring Shawn Wagner: Treating Omauri Boeve/Extender: Tori Milks in Treatment: 0 Foot Assessment Items Site Locations + = Sensation present, - = Sensation absent, C = Callus, U = Ulcer R = Redness, W = Warmth, M = Maceration, PU = Pre-ulcerative lesion F = Fissure, S = Swelling, Wagner =  Dryness Assessment Right: Left: Other Deformity: No No Prior Foot Ulcer: No No Prior Amputation: No No Charcot Joint: No No Ambulatory Status: Ambulatory Without Help Gait: Steady Electronic Signature(s) Signed: 06/23/2022 4:41:51 PM By: Shawn Pax RN Entered By: Shawn Wagner on 06/23/2022 13:47:25 -------------------------------------------------------------------------------- Nutrition Risk Screening Details Patient Name: Date of Service: Shawn Wagner MES Wagner. 06/23/2022 1:30 PM Medical Record Number: [id number] Patient Account Number: [id number] Date of Birth/Sex: Treating RN: 08-21-1950 (71 y.o. Shawn Wagner Primary Care Square [place]ow[place]: Cleon Dew Other Clinician: Referring Kamrin Sibley: Treating Admiral Marcucci/Extender: Tori Milks in Treatment: 0 Height (in): 73 Weight (lbs): 182 Body Mass Index (BMI): 24 ORBIE, GRUPE Wagner ([id number]) [id number] Page 4 of 4 Nutrition Risk Screening Items Score Screening NUTRITION RISK SCREEN: I have an illness or condition that made me change the kind and/or amount of food I eat 0 No I eat fewer than two meals per day 0 No I eat few fruits and vegetables, or milk products 0 No I have three or more drinks of beer, liquor or wine almost every day 0 No I have tooth or mouth problems that make it hard for me to eat 0 No I don't always have enough money to buy the food I need 0 No I eat alone most of the time 0 No I take three or more different prescribed or over-the-counter drugs a day 1 Yes Without wanting to, I have lost or gained 10 pounds in the last six months 0 No I am not always physically able to shop, cook and/or feed myself 0 No Nutrition Protocols Good Risk Protocol 0 No interventions needed Moderate Risk Protocol High Risk Proctocol Risk Level: Good Risk Score: 1 Electronic Signature(s) Signed: 06/23/2022 4:41:51 PM By: Shawn Pax RN Entered By: Shawn Wagner on  06/23/2022 13:41:44

## 2022-06-24 NOTE — Progress Notes (Signed)
UGONNA, KEIRSEY ([id number]) [id number] Page 1 of 6 Visit Report for 06/23/2022 Chief Complaint Document Details Patient Name: Date of Service: Shawn Wagner MES D. 06/23/2022 1:30 PM Medical Record Number: [id number] Patient Account Number: [id number] Date of Birth/Sex: Treating RN: February 26, 1951 (72 y.o. Shawn Wagner) Yevonne Pax Primary Care Provider: Cleon Dew Other Clinician: Referring Provider: Treating Provider/Extender: Tori Milks in Treatment: 0 Information Obtained from: Patient Chief Complaint 06/23/2022; right lower extremity wound Electronic Signature(s) Signed: 06/23/2022 2:17:41 PM By: Geralyn Corwin DO Entered By: Geralyn Corwin on 06/23/2022 14:11:02 -------------------------------------------------------------------------------- HPI Details Patient Name: Date of Service: Shawn Wagner, JA MES D. 06/23/2022 1:30 PM Medical Record Number: [id number] Patient Account Number: [id number] Date of Birth/Sex: Treating RN: [birth year] (71 y.o. Shawn Wagner Primary Care Provider: Cleon Dew Other Clinician: Referring Provider: Treating Provider/Extender: Tori Milks in Treatment: 0 History of Present Illness HPI Description: 06/23/2022 Shawn Wagner is a 72 year old male with a past medical history of chronic pain syndrome, CAD, CVA, Chronic venous insufficiency and type 2 diabetes That presents to the clinic for a several year history of wounds to his right lower extremity. He states that this started out as a MRSA infection many years ago and the wounds will heal however he will pick at the scabs and reopen the areas. He currently denies signs of infection. He keeps the areas covered. He has compression stockings but does not wear them. Electronic Signature(s) Signed: 06/23/2022 2:17:41 PM By: Geralyn Corwin DO Entered By: Geralyn Corwin on 06/23/2022  14:11:58 -------------------------------------------------------------------------------- Physical Exam Details Patient Name: Date of Service: Shawn Wagner MES D. 06/23/2022 1:30 PM Medical Record Number: [id number] Patient Account Number: [id number] Date of Birth/Sex: Treating RN: 03-Jun-1950 (71 y.o. Shawn Wagner Primary Care Provider: Cleon Dew Other Clinician: Referring Provider: Treating Provider/Extender: Tori Milks in Treatment: 0 Constitutional . Cardiovascular . Psychiatric . Notes Right lower extremity: T the anterior aspect there are a few scattered open wounds with granulation tissue present. No signs of infection. 2+ pitting edema to Shawn Wagner ([id number]) [phone].pdf Page 2 of 6 the knee. Hemosiderin staining noted to the anterior aspect. Electronic Signature(s) Signed: 06/23/2022 2:17:41 PM By: Geralyn Corwin DO Entered By: Geralyn Corwin on 06/23/2022 14:13:03 -------------------------------------------------------------------------------- Physician Orders Details Patient Name: Date of Service: Shawn Wagner, Fawn Kirk MES D. 06/23/2022 1:30 PM Medical Record Number: [id number] Patient Account Number: [id number] Date of Birth/Sex: Treating RN: 10-12-1950 (71 y.o. Shawn Wagner Primary Care Provider: Cleon Dew Other Clinician: Referring Provider: Treating Provider/Extender: Tori Milks in Treatment: 0 Verbal / Phone Orders: No Diagnosis Coding ICD-10 Coding Code Description E11.622 Type 2 diabetes mellitus with other skin ulcer L97.812 Non-pressure chronic ulcer of other part of right lower leg with fat layer exposed Follow-up Appointments Return Appointment in 1 week. Bathing/ Shower/ Hygiene May shower with wound dressing protected with water repellent cover or cast protector. Anesthetic (Use 'Patient Medications' Section for Anesthetic Order  Entry) Lidocaine applied to wound bed Edema Control - Lymphedema / Segmental Compressive Device / Other Elevate, Exercise Daily and A void Standing for Long Periods of Time. Elevate legs to the level of the heart and pump ankles as often as possible Elevate leg(s) parallel to the floor when sitting. Off-Loading Open toe surgical shoe - right foot Wound Treatment Wound #1 - Lower Leg Wound Laterality: Right Cleanser: Soap and Water 1 x Per Week/30 Days Discharge Instructions: Gently cleanse wound with  antibacterial soap, rinse and pat dry prior to dressing wounds Cleanser: Wound Cleanser 1 x Per Week/30 Days Discharge Instructions: Wash your hands with soap and water. Remove old dressing, discard into plastic bag and place into trash. Cleanse the wound with Wound Cleanser prior to applying a clean dressing using gauze sponges, not tissues or cotton balls. Do not scrub or use excessive force. Pat dry using gauze sponges, not tissue or cotton balls. Topical: Gentamicin 1 x Per Week/30 Days Discharge Instructions: Apply as directed by provider. Topical: Mupirocin Ointment 1 x Per Week/30 Days Discharge Instructions: Apply as directed by provider. Prim Dressing: Hydrofera Blue Ready Transfer Foam, 2.5x2.5 (in/in) 1 x Per Week/30 Days ary Discharge Instructions: Apply Hydrofera Blue Ready to wound bed as directed Secondary Dressing: Gauze 1 x Per Week/30 Days Discharge Instructions: As directed: dry, moistened with saline or moistened with Dakins Solution Compression Wrap: Urgo K2 Lite, two layer compression system, regular 1 x Per Week/30 Days Electronic Signature(s) Signed: 06/23/2022 3:21:24 PM By: Geralyn Corwin DO Signed: 06/23/2022 4:41:51 PM By: Yevonne Pax RN Previous Signature: 06/23/2022 2:17:41 PM Version By: Geralyn Corwin DO Entered By: Yevonne Pax on 06/23/2022 14:33:17 Shawn Wagner ([id number]) [id number] Page 3 of  6 -------------------------------------------------------------------------------- Problem List Details Patient Name: Date of Service: Shawn Wagner MES D. 06/23/2022 1:30 PM Medical Record Number: [id number] Patient Account Number: [id number] Date of Birth/Sex: Treating RN: [birth year] (72 y.o. Shawn Wagner) Yevonne Pax Primary Care Provider: Cleon Dew Other Clinician: Referring Provider: Treating Provider/Extender: Tori Milks in Treatment: 0 Active Problems ICD-10 Encounter Code Description Active Date MDM Diagnosis [phone] Non-pressure chronic ulcer of other part of right lower leg with fat layer 06/23/2022 No Yes exposed I87.311 Chronic venous hypertension (idiopathic) with ulcer of right lower extremity 06/23/2022 No Yes E11.622 Type 2 diabetes mellitus with other skin ulcer 06/23/2022 No Yes Inactive Problems Resolved Problems Electronic Signature(s) Signed: 06/23/2022 2:17:41 PM By: Geralyn Corwin DO Entered By: Geralyn Corwin on 06/23/2022 14:10:49 -------------------------------------------------------------------------------- Progress Note Details Patient Name: Date of Service: Shawn Wagner, Fawn Kirk MES D. 06/23/2022 1:30 PM Medical Record Number: [id number] Patient Account Number: [id number] Date of Birth/Sex: Treating RN: 1950/03/23 (71 y.o. Shawn Wagner Primary Care Provider: Cleon Dew Other Clinician: Referring Provider: Treating Provider/Extender: Tori Milks in Treatment: 0 Subjective Chief Complaint Information obtained from Patient 06/23/2022; right lower extremity wound History of Present Illness (HPI) 06/23/2022 Shawn Wagner is a 72 year old male with a past medical history of chronic pain syndrome, CAD, CVA, Chronic venous insufficiency and type 2 diabetes That presents to the clinic for a several year history of wounds to his right lower extremity. He states that this started out as a MRSA  infection many years ago and the wounds will heal however he will pick at the scabs and reopen the areas. He currently denies signs of infection. He keeps the areas covered. He has compression stockings but does not wear them. Patient History Allergies hydrocodone Social History Never smoker, Marital Status - Divorced, Alcohol Use - Never, Drug Use - No History, Caffeine Use - Daily. Medical History Cardiovascular Patient has history of Coronary Artery Disease, Hypertension Endocrine Patient has history of Type II Diabetes MANN, SKAGGS ([id number]) [id number] Page 4 of 6 Patient is treated with Insulin. Review of Systems (ROS) Eyes Complains or has symptoms of Vision Changes, Glasses / Contacts. Integumentary (Skin) Complains or has symptoms of Wounds, Swelling. Objective Constitutional Vitals Time Taken: 1:25 PM, Height: 73 in,  Source: Stated, Weight: 182 lbs, Source: Stated, BMI: 24, Temperature: 97.8 F, Pulse: 81 bpm, Respiratory Rate: 18 breaths/min, Blood Pressure: 106/67 mmHg. General Notes: Right lower extremity: T the anterior aspect there are a few scattered open wounds with granulation tissue present. No signs of infection. 2+ o pitting edema to the knee. Hemosiderin staining noted to the anterior aspect. Integumentary (Hair, Skin) Wound #1 status is Open. Original cause of wound was Gradually Appeared. The date acquired was: 03/08/2016. The wound is located on the Right Lower Leg. The wound measures 8cm length x 14cm width x 0.1cm depth; 87.965cm^2 area and 8.796cm^3 volume. There is Fat Layer (Subcutaneous Tissue) exposed. There is no tunneling or undermining noted. There is a medium amount of serosanguineous drainage noted. There is medium (34-66%) red granulation within the wound bed. There is a medium (34-66%) amount of necrotic tissue within the wound bed including Adherent Slough. Assessment Active Problems ICD-10 Non-pressure  chronic ulcer of other part of right lower leg with fat layer exposed Chronic venous hypertension (idiopathic) with ulcer of right lower extremity Type 2 diabetes mellitus with other skin ulcer Patient presents with a several year history of waxing and waning to the wounds to his right lower extremity. Overall they appear healthy and well-healing. These are likely nonhealing due to type 2 diabetes and venous insufficiency but mostly because the patient scratches at them. There are no suspicious lesions noted. He does have evidence of venous insufficiency And would benefit from a compression wrap. At this time I recommended Hydrofera Blue with antibiotic ointment under compression therapy. ABIs in office were 1.23 and he is strong pedal pulses suggesting adequate blood flow for healing. We will start with a 3 layer compression. He knows to not get this wet or keep this on for more than 7 days. I recommended a cast protector bag if he chooses to shower. Follow-up in 1 week. Procedures Wound #1 Pre-procedure diagnosis of Wound #1 is a Diabetic Wound/Ulcer of the Lower Extremity located on the Right Lower Leg . There was a Double Layer Compression Therapy Procedure by Yevonne Pax, RN. Post procedure Diagnosis Wound #1: Same as Pre-Procedure Plan Follow-up Appointments: Return Appointment in 1 week. Bathing/ Shower/ Hygiene: May shower with wound dressing protected with water repellent cover or cast protector. Anesthetic (Use 'Patient Medications' Section for Anesthetic Order Entry): Lidocaine applied to wound bed Edema Control - Lymphedema / Segmental Compressive Device / Other: Elevate, Exercise Daily and Avoid Standing for Long Periods of Time. Elevate legs to the level of the heart and pump ankles as often as possible Elevate leg(s) parallel to the floor when sitting. WOUND #1: - Lower Leg Wound Laterality: Right Cleanser: Soap and Water 1 x Per Week/30 Days Discharge Instructions: Gently  cleanse wound with antibacterial soap, rinse and pat dry prior to dressing wounds Cleanser: Wound Cleanser 1 x Per Week/30 Days TYLEEK, SMICK ([id number]) [id number] Page 5 of 6 Discharge Instructions: Wash your hands with soap and water. Remove old dressing, discard into plastic bag and place into trash. Cleanse the wound with Wound Cleanser prior to applying a clean dressing using gauze sponges, not tissues or cotton balls. Do not scrub or use excessive force. Pat dry using gauze sponges, not tissue or cotton balls. Topical: Gentamicin 1 x Per Week/30 Days Discharge Instructions: Apply as directed by provider. Topical: Mupirocin Ointment 1 x Per Week/30 Days Discharge Instructions: Apply as directed by provider. Prim Dressing: Hydrofera Blue Ready Transfer Foam, 2.5x2.5 (in/in) 1 x Per  Week/30 Days ary Discharge Instructions: Apply Hydrofera Blue Ready to wound bed as directed Secondary Dressing: Gauze 1 x Per Week/30 Days Discharge Instructions: As directed: dry, moistened with saline or moistened with Dakins Solution Com pression Wrap: Urgo K2 Lite, two layer compression system, regular 1 x Per Week/30 Days 1. Hydrofera Blue with antibiotic ointment under 3 layer compressionooright lower extremity 2. Follow-up in 1 week Electronic Signature(s) Signed: 06/23/2022 2:17:41 PM By: Geralyn Corwin DO Entered By: Geralyn Corwin on 06/23/2022 14:16:30 -------------------------------------------------------------------------------- ROS/PFSH Details Patient Name: Date of Service: Shawn Wagner, JA MES D. 06/23/2022 1:30 PM Medical Record Number: [id number] Patient Account Number: [id number] Date of Birth/Sex: Treating RN: 06-19-1950 (71 y.o. Shawn Wagner) Yevonne Pax Primary Care Provider: Cleon Dew Other Clinician: Referring Provider: Treating Provider/Extender: Tori Milks in Treatment: 0 Eyes Complaints and Symptoms: Positive  for: Vision Changes; Glasses / Contacts Integumentary (Skin) Complaints and Symptoms: Positive for: Wounds; Swelling Cardiovascular Medical History: Positive for: Coronary Artery Disease; Hypertension Endocrine Medical History: Positive for: Type II Diabetes Time with diabetes: 26 Treated with: Insulin Immunizations Pneumococcal Vaccine: Received Pneumococcal Vaccination: No Implantable Devices No devices added Family and Social History Never smoker; Marital Status - Divorced; Alcohol Use: Never; Drug Use: No History; Caffeine Use: Daily Electronic Signature(s) Signed: 06/23/2022 2:17:41 PM By: Geralyn Corwin DO Signed: 06/23/2022 4:41:51 PM By: Yevonne Pax RN Entered By: Yevonne Pax on 06/23/2022 13:40:00 Shawn Wagner ([id number]) [id number] Page 6 of 6 -------------------------------------------------------------------------------- SuperBill Details Patient Name: Date of Service: Shawn Wagner MES D. 06/23/2022 Medical Record Number: [id number] Patient Account Number: [id number] Date of Birth/Sex: Treating RN: Nov 26, 1950 (72 y.o. Shawn Wagner) Yevonne Pax Primary Care Provider: Cleon Dew Other Clinician: Referring Provider: Treating Provider/Extender: Tori Milks in Treatment: 0 Diagnosis Coding ICD-10 Codes Code Description [phone] Non-pressure chronic ulcer of other part of right lower leg with fat layer exposed I87.311 Chronic venous hypertension (idiopathic) with ulcer of right lower extremity E11.622 Type 2 diabetes mellitus with other skin ulcer Facility Procedures : CPT4 Code: 10272536 Description: 99213 - WOUND CARE VISIT-LEV 3 EST PT Modifier: Quantity: 1 : CPT4 Code: [id number] Description: (Facility Use Only) 25956LO - APPLY MULTLAY COMPRS LWR RT LEG Modifier: Quantity: 1 Physician Procedures : CPT4 Code Description Modifier [id number] - WC PHYS LEVEL 4 - NEW PT ICD-10 Diagnosis Description  L97.812 Non-pressure chronic ulcer of other part of right lower leg with fat layer exposed I87.311 Chronic venous hypertension (idiopathic) with ulcer  of right lower extremity E11.622 Type 2 diabetes mellitus with other skin ulcer Quantity: 1 Electronic Signature(s) Signed: 06/23/2022 2:29:36 PM By: Yevonne Pax RN Signed: 06/23/2022 3:21:24 PM By: Geralyn Corwin DO Previous Signature: 06/23/2022 2:28:35 PM Version By: Yevonne Pax RN Previous Signature: 06/23/2022 2:17:41 PM Version By: Geralyn Corwin DO Entered By: Yevonne Pax on 06/23/2022 14:29:35

## 2022-06-24 NOTE — Progress Notes (Signed)
Shawn, CROMIE Wagner ([id number]) [id number] Page 1 of 8 Visit Report for 06/23/2022 Allergy List Details Patient Name: Date of Service: Shawn Wagner MES Wagner. 06/23/2022 1:30 PM Medical Record Number: [id number] Patient Account Number: [id number] Date of Birth/Sex: Treating RN: 1951/02/11 (72 y.o. Shawn Wagner) Yevonne Pax Primary Care Kym Fenter: Cleon Dew Other Clinician: Referring Bernardine Langworthy: Treating Christol Thetford/Extender: Tori Milks in Treatment: 0 Allergies Active Allergies hydrocodone Allergy Notes Electronic Signature(s) Signed: 06/23/2022 4:41:51 PM By: Yevonne Pax RN Entered By: Yevonne Pax on 06/23/2022 13:36:29 -------------------------------------------------------------------------------- Arrival Information Details Patient Name: Date of Service: Shawn Wagner, Shawn Wagner MES Wagner. 06/23/2022 1:30 PM Medical Record Number: [id number] Patient Account Number: [id number] Date of Birth/Sex: Treating RN: [birth year] (71 y.o. Shawn Wagner Primary Care Bright Spielmann: Cleon Dew Other Clinician: Referring Vincenta Steffey: Treating Canary Fister/Extender: Tori Milks in Treatment: 0 Visit Information Patient Arrived: Ambulatory Arrival Time: 13:35 Accompanied By: daughter Transfer Assistance: None Patient Identification Verified: Yes Secondary Verification Process Completed: Yes Patient Requires Transmission-Based Precautions: No Patient Has Alerts: No Electronic Signature(s) Signed: 06/23/2022 4:41:51 PM By: Yevonne Pax RN Entered By: Yevonne Pax on 06/23/2022 13:35:20 -------------------------------------------------------------------------------- Clinic Level of Care Assessment Details Patient Name: Date of Service: Shawn Wagner MES Wagner. 06/23/2022 1:30 PM Medical Record Number: [id number] Patient Account Number: [id number] Date of Birth/Sex: Treating RN: 1951-03-07 (71 y.o. Shawn Wagner Primary Care Preslea Rhodus:  Cleon Dew Other Clinician: Referring Shalunda Lindh: Treating Lianette Broussard/Extender: Tori Milks in Treatment: 0 Clinic Level of Care Assessment Items TOOL 1 Quantity Score X- 1 0 Use when EandM and Procedure is performed on INITIAL visit ASSESSMENTS - Nursing Assessment / Reassessment X- 1 20 General Physical Exam (combine w/ comprehensive assessment (listed just below) when performed on new pt. evals) X- 1 25 Comprehensive Assessment (HX, ROS, Risk Assessments, Wounds Hx, etc.) DONOVIN, KRAEMER ([id number]) [id number].pdf Page 2 of 8 ASSESSMENTS - Wound and Skin Assessment / Reassessment []  - 0 Dermatologic / Skin Assessment (not related to wound area) ASSESSMENTS - Ostomy and/or Continence Assessment and Care []  - 0 Incontinence Assessment and Management []  - 0 Ostomy Care Assessment and Management (repouching, etc.) PROCESS - Coordination of Care X - Simple Patient / Family Education for ongoing care 1 15 []  - 0 Complex (extensive) Patient / Family Education for ongoing care []  - 0 Staff obtains Chiropractor, Records, T Results / Process Orders est []  - 0 Staff telephones HHA, Nursing Homes / Clarify orders / etc []  - 0 Routine Transfer to another Facility (non-emergent condition) []  - 0 Routine Hospital Admission (non-emergent condition) X- 1 15 New Admissions / Manufacturing engineer / Ordering NPWT Apligraf, etc. , []  - 0 Emergency Hospital Admission (emergent condition) PROCESS - Special Needs []  - 0 Pediatric / Minor Patient Management []  - 0 Isolation Patient Management []  - 0 Hearing / Language / Visual special needs []  - 0 Assessment of Community assistance (transportation, Wagner/C planning, etc.) []  - 0 Additional assistance / Altered mentation []  - 0 Support Surface(s) Assessment (bed, cushion, seat, etc.) INTERVENTIONS - Miscellaneous []  - 0 External ear exam []  - 0 Patient Transfer (multiple staff /  Nurse, adult / Similar devices) []  - 0 Simple Staple / Suture removal (25 or less) []  - 0 Complex Staple / Suture removal (26 or more) []  - 0 Hypo/Hyperglycemic Management (do not check if billed separately) X- 1 15 Ankle / Brachial Index (ABI) - do not check if billed separately Has the patient been seen at the  hospital within the last three years: Yes Total Score: 90 Level Of Care: New/Established - Level 3 Electronic Signature(s) Signed: 06/23/2022 4:41:51 PM By: Yevonne Pax RN Entered By: Yevonne Pax on 06/23/2022 14:28:24 -------------------------------------------------------------------------------- Compression Therapy Details Patient Name: Date of Service: Shawn Wagner, Shawn Wagner MES Wagner. 06/23/2022 1:30 PM Medical Record Number: [id number] Patient Account Number: [id number] Date of Birth/Sex: Treating RN: [birth year] (71 y.o. Shawn Wagner Primary Care Corrine Tillis: Cleon Dew Other Clinician: Referring Alexie Lanni: Treating Burgundy Matuszak/Extender: Tori Milks in Treatment: 0 Compression Therapy Performed for Wound Assessment: Wound #1 Right Lower Leg Performed By: Clinician Yevonne Pax, RN Compression Type: Double Layer Post Procedure Diagnosis Same as Pre-procedure Electronic Signature(s) Signed: 06/23/2022 4:41:51 PM By: Yevonne Pax RN Bradley Ferris Wagner ([id number]) PM By: Yevonne Pax RN [phone].pdf Page 3 of 8 Signed: 06/23/2022 4:41:51 Entered By: Yevonne Pax on 06/23/2022 14:06:36 -------------------------------------------------------------------------------- Encounter Discharge Information Details Patient Name: Date of Service: Shawn Wagner MES Wagner. 06/23/2022 1:30 PM Medical Record Number: [id number] Patient Account Number: [id number] Date of Birth/Sex: Treating RN: December 15, 1950 (72 y.o. Shawn Wagner) Yevonne Pax Primary Care Mirabel Ahlgren: Cleon Dew Other Clinician: Referring Pate Aylward: Treating Zaliah Wissner/Extender: Tori Milks in Treatment: 0 Encounter Discharge Information Items Discharge Condition: Stable Ambulatory Status: Ambulatory Discharge Destination: Home Transportation: Private Auto Accompanied By: caregiver Schedule Follow-up Appointment: Yes Clinical Summary of Care: Electronic Signature(s) Signed: 06/23/2022 2:29:13 PM By: Yevonne Pax RN Entered By: Yevonne Pax on 06/23/2022 14:29:12 -------------------------------------------------------------------------------- Lower Extremity Assessment Details Patient Name: Date of Service: Shawn Wagner MES Wagner. 06/23/2022 1:30 PM Medical Record Number: [id number] Patient Account Number: [id number] Date of Birth/Sex: Treating RN: 06/21/1950 (71 y.o. Shawn Wagner Primary Care Azam Gervasi: Cleon Dew Other Clinician: Referring Delesa Kawa: Treating Analei Whinery/Extender: Tori Milks in Treatment: 0 Edema Assessment Assessed: Kyra Searles: No] [Right: No] Edema: [Left: Ye] [Right: s] Calf Left: Right: Point of Measurement: 36 cm From Medial Instep 35 cm Ankle Left: Right: Point of Measurement: 12 cm From Medial Instep 21.5 cm Knee To Floor Left: Right: From Medial Instep 46 cm Vascular Assessment Pulses: Dorsalis Pedis Palpable: [Right:Yes] Doppler Audible: [Right:Yes] Blood Pressure: Brachial: [Right:106] Ankle: [Right:Dorsalis Pedis: 130 1.23] Electronic Signature(s) Signed: 06/23/2022 4:41:51 PM By: Yevonne Pax RN Entered By: Yevonne Pax on 06/23/2022 13:55:26 Kem Kays ([id number]) [id number] Page 4 of 8 -------------------------------------------------------------------------------- Multi Wound Chart Details Patient Name: Date of Service: Shawn Wagner MES Wagner. 06/23/2022 1:30 PM Medical Record Number: [id number] Patient Account Number: [id number] Date of Birth/Sex: Treating RN: 1950-04-04 (71 y.o. Shawn Wagner) Yevonne Pax Primary Care Dorinda Stehr: Cleon Dew Other Clinician: Referring Angela Platner: Treating Kiren Mcisaac/Extender: Tori Milks in Treatment: 0 Vital Signs Height(in): 73 Pulse(bpm): 81 Weight(lbs): 182 Blood Pressure(mmHg): 106/67 Body Mass Index(BMI): 24 Temperature(F): 97.8 Respiratory Rate(breaths/min): 18 [1:Photos:] [N/A:N/A] Right Lower Leg N/A N/A Wound Location: Gradually Appeared N/A N/A Wounding Event: Diabetic Wound/Ulcer of the Lower N/A N/A Primary Etiology: Extremity Coronary Artery Disease, N/A N/A Comorbid History: Hypertension, Type II Diabetes 03/08/2016 N/A N/A Date Acquired: 0 N/A N/A Weeks of Treatment: Open N/A N/A Wound Status: No N/A N/A Wound Recurrence: Yes N/A N/A Clustered Wound: 8x14x0.1 N/A N/A Measurements L x W x Wagner (cm) 87.965 N/A N/A A (cm) : rea 8.796 N/A N/A Volume (cm) : Grade 1 N/A N/A Classification: Medium N/A N/A Exudate A mount: Serosanguineous N/A N/A Exudate Type: red, brown N/A N/A Exudate Color: Medium (34-66%) N/A N/A Granulation A mount: Red N/A N/A Granulation Quality:  Medium (34-66%) N/A N/A Necrotic A mount: Fat Layer (Subcutaneous Tissue): Yes N/A N/A Exposed Structures: Fascia: No Tendon: No Muscle: No Joint: No Bone: No None N/A N/A Epithelialization: Compression Therapy N/A N/A Procedures Performed: Treatment Notes Electronic Signature(s) Signed: 06/23/2022 2:17:41 PM By: Geralyn Corwin DO Entered By: Geralyn Corwin on 06/23/2022 14:10:54 -------------------------------------------------------------------------------- Multi-Disciplinary Care Plan Details Patient Name: Date of Service: Shawn Wagner, Shawn Wagner MES Wagner. 06/23/2022 1:30 PM Medical Record Number: [id number] Patient Account Number: [id number] Date of Birth/Sex: Treating RN: 06/02/1950 (71 y.o. Shawn Wagner Primary Care Raiven Belizaire: Cleon Dew Other Clinician: Referring Ronell Duffus: Treating Satia Winger/Extender: Kentavious, Michele ([id number]) [phone].pdf Page 5 of 8 Weeks in Treatment: 0 Active Inactive Necrotic Tissue Nursing Diagnoses: Knowledge deficit related to management of necrotic/devitalized tissue Goals: Necrotic/devitalized tissue will be minimized in the wound bed Date Initiated: 06/23/2022 Target Resolution Date: 07/23/2022 Goal Status: Active Interventions: Assess patient pain level pre-, during and post procedure and prior to discharge Notes: Nutrition Nursing Diagnoses: Potential for alteratiion in Nutrition/Potential for imbalanced nutrition Goals: Patient/caregiver verbalizes understanding of need to maintain therapeutic glucose control per primary care physician Date Initiated: 06/23/2022 Target Resolution Date: 07/23/2022 Goal Status: Active Interventions: Assess HgA1c results as ordered upon admission and as needed Assess patient nutrition upon admission and as needed per policy Notes: Wound/Skin Impairment Nursing Diagnoses: Knowledge deficit related to ulceration/compromised skin integrity Goals: Patient/caregiver will verbalize understanding of skin care regimen Date Initiated: 06/23/2022 Target Resolution Date: 07/23/2022 Goal Status: Active Ulcer/skin breakdown will have a volume reduction of 30% by week 4 Date Initiated: 06/23/2022 Target Resolution Date: 07/23/2022 Goal Status: Active Ulcer/skin breakdown will have a volume reduction of 50% by week 8 Date Initiated: 06/23/2022 Target Resolution Date: 08/23/2022 Goal Status: Active Ulcer/skin breakdown will have a volume reduction of 80% by week 12 Date Initiated: 06/23/2022 Target Resolution Date: 09/22/2022 Goal Status: Active Ulcer/skin breakdown will heal within 14 weeks Date Initiated: 06/23/2022 Target Resolution Date: 10/23/2022 Goal Status: Active Interventions: Assess patient/caregiver ability to obtain necessary supplies Assess patient/caregiver ability to perform  ulcer/skin care regimen upon admission and as needed Notes: Electronic Signature(s) Signed: 06/23/2022 4:41:51 PM By: Yevonne Pax RN Entered By: Yevonne Pax on 06/23/2022 13:57:14 -------------------------------------------------------------------------------- Pain Assessment Details Patient Name: Date of Service: Shawn Wagner MES Wagner. 06/23/2022 1:30 PM Medical Record Number: [id number] Patient Account Number: [id number] FARRIS, GEIMAN ([id number]) [phone].pdf Page 6 of 8 Date of Birth/Sex: Treating RN: 10-17-1950 (72 y.o. Shawn Wagner) Yevonne Pax Primary Care Brace Welte: Other Clinician: Cleon Dew Referring Rayana Geurin: Treating Shamia Uppal/Extender: Tori Milks in Treatment: 0 Active Problems Location of Pain Severity and Description of Pain Patient Has Paino No Site Locations Pain Management and Medication Current Pain Management: Electronic Signature(s) Signed: 06/23/2022 4:41:51 PM By: Yevonne Pax RN Entered By: Yevonne Pax on 06/23/2022 13:35:27 -------------------------------------------------------------------------------- Patient/Caregiver Education Details Patient Name: Date of Service: Shawn Wagner MES Wagner. 4/17/2024andnbsp1:30 PM Medical Record Number: [id number] Patient Account Number: [id number] Date of Birth/Gender: Treating RN: Jul 14, 1950 (71 y.o. Shawn Wagner Primary Care Physician: Cleon Dew Other Clinician: Referring Physician: Treating Physician/Extender: Tori Milks in Treatment: 0 Education Assessment Education Provided To: Patient Education Topics Provided Welcome [facility]New Patient Packet: o Handouts: Welcome T [facility] o Methods: Explain/Verbal Responses: State content correctly Electronic Signature(s) Signed: 06/23/2022 4:41:51 PM By: Yevonne Pax RN Entered By: Yevonne Pax on 06/23/2022  13:57:32 -------------------------------------------------------------------------------- Wound Assessment Details Patient Name: Date of  Service: Shawn Wagner MES Wagner. 06/23/2022 1:30 PM Medical Record Number: [id number] Patient Account Number: [id number] Date of Birth/Sex: Treating RN: April 29, 1950 ([address] y.o. Shawn Wagner) [place], [place], [place] Wagner ([id number]) [phone].pdf Page 7 of 8 Primary Care Amaurie Wandel: Cleon Dew Other Clinician: Referring Cristan Hout: Treating Yuvraj Pfeifer/Extender: Tori Milks in Treatment: 0 Wound Status Wound Number: 1 Primary Etiology: Diabetic Wound/Ulcer of the Lower Extremity Wound Location: Right Lower Leg Wound Status: Open Wounding Event: Gradually Appeared Comorbid History: Coronary Artery Disease, Hypertension, Type II Diabetes Date Acquired: 03/08/2016 Weeks Of Treatment: 0 Clustered Wound: Yes Photos Wound Measurements Length: (cm) 8 Width: (cm) 14 Depth: (cm) 0.1 Area: (cm) 87.965 Volume: (cm) 8.796 % Reduction in Area: % Reduction in Volume: Epithelialization: None Tunneling: No Undermining: No Wound Description Classification: Grade 1 Exudate Amount: Medium Exudate Type: Serosanguineous Exudate Color: red, brown Foul Odor After Cleansing: No Slough/Fibrino Yes Wound Bed Granulation Amount: Medium (34-66%) Exposed Structure Granulation Quality: Red Fascia Exposed: No Necrotic Amount: Medium (34-66%) Fat Layer (Subcutaneous Tissue) Exposed: Yes Necrotic Quality: Adherent Slough Tendon Exposed: No Muscle Exposed: No Joint Exposed: No Bone Exposed: No Treatment Notes Wound #1 (Lower Leg) Wound Laterality: Right Cleanser Soap and Water Discharge Instruction: Gently cleanse wound with antibacterial soap, rinse and pat dry prior to dressing wounds Wound Cleanser Discharge Instruction: Wash your hands with soap and water. Remove old dressing, discard into plastic bag and place into  trash. Cleanse the wound with Wound Cleanser prior to applying a clean dressing using gauze sponges, not tissues or cotton balls. Do not scrub or use excessive force. Pat dry using gauze sponges, not tissue or cotton balls. Peri-Wound Care Topical Gentamicin Discharge Instruction: Apply as directed by Amatullah Christy. Mupirocin Ointment Discharge Instruction: Apply as directed by Kunal Levario. Primary Dressing Hydrofera Blue Ready Transfer Foam, 2.5x2.5 (in/in) Discharge Instruction: Apply Hydrofera Blue Ready to wound bed as directed Secondary Dressing ZION, TA ([id number]) [id number].pdf Page 8 of 8 Gauze Discharge Instruction: As directed: dry, moistened with saline or moistened with Dakins Solution Secured With Compression Wrap Urgo K2 Lite, two layer compression system, regular Compression Stockings Add-Ons Electronic Signature(s) Signed: 06/23/2022 4:41:51 PM By: Yevonne Pax RN Entered By: Yevonne Pax on 06/23/2022 13:54:15 -------------------------------------------------------------------------------- Vitals Details Patient Name: Date of Service: Shawn Wagner, JA MES Wagner. 06/23/2022 1:30 PM Medical Record Number: [id number] Patient Account Number: [id number] Date of Birth/Sex: Treating RN: June 05, 1950 (71 y.o. Shawn Wagner) Yevonne Pax Primary Care Teasha Murrillo: Cleon Dew Other Clinician: Referring Marlis Oldaker: Treating Verity Gilcrest/Extender: Tori Milks in Treatment: 0 Vital Signs Time Taken: 13:25 Temperature (F): 97.8 Height (in): 73 Pulse (bpm): 81 Source: Stated Respiratory Rate (breaths/min): 18 Weight (lbs): 182 Blood Pressure (mmHg): 106/67 Source: Stated Reference Range: 80 - 120 mg / dl Body Mass Index (BMI): 24 Electronic Signature(s) Signed: 06/23/2022 4:41:51 PM By: Yevonne Pax RN Entered By: Yevonne Pax on 06/23/2022 13:36:08

## 2022-06-28 DIAGNOSIS — D490 Neoplasm of unspecified behavior of digestive system: Secondary | ICD-10-CM | POA: Insufficient documentation

## 2022-06-30 ENCOUNTER — Encounter (HOSPITAL_BASED_OUTPATIENT_CLINIC_OR_DEPARTMENT_OTHER): Payer: Medicare PPO | Admitting: Internal Medicine

## 2022-06-30 DIAGNOSIS — L97812 Non-pressure chronic ulcer of other part of right lower leg with fat layer exposed: Secondary | ICD-10-CM | POA: Diagnosis not present

## 2022-06-30 DIAGNOSIS — I87311 Chronic venous hypertension (idiopathic) with ulcer of right lower extremity: Secondary | ICD-10-CM

## 2022-06-30 DIAGNOSIS — E11622 Type 2 diabetes mellitus with other skin ulcer: Secondary | ICD-10-CM

## 2022-07-02 NOTE — Progress Notes (Signed)
Shawn Wagner ([id number]) [id number] Page 1 of 5 Visit Report for 06/30/2022 Chief Complaint Document Details Patient Name: Date of Service: Shawn Wagner Shawn Wagner. 06/30/2022 8:00 A M Medical Record Number: [id number] Patient Account Number: [id number] Date of Birth/Sex: Treating RN: 05-Jan-1951 (72 y.o. Shawn Wagner) Shawn Wagner Primary Care Provider: Cleon Dew Other Clinician: Referring Provider: Treating Provider/Extender: Tori Milks in Treatment: 1 Information Obtained from: Patient Chief Complaint 06/23/2022; right lower extremity wound Electronic Signature(s) Signed: 06/30/2022 9:36:24 AM By: Geralyn Corwin DO Entered By: Geralyn Corwin on 06/30/2022 08:54:39 -------------------------------------------------------------------------------- HPI Details Patient Name: Date of Service: Shawn Wagner Shawn Wagner. 06/30/2022 8:00 A M Medical Record Number: [id number] Patient Account Number: [id number] Date of Birth/Sex: Treating RN: 1950/03/18 (71 y.o. Shawn Wagner Primary Care Provider: Cleon Dew Other Clinician: Referring Provider: Treating Provider/Extender: Tori Milks in Treatment: 1 History of Present Illness HPI Description: 06/23/2022 Shawn Wagner is a 72 year old male with a past medical history of chronic pain syndrome, CAD, CVA, Chronic venous insufficiency and type 2 diabetes That presents to the clinic for a several year history of wounds to his right lower extremity. He states that this started out as a MRSA infection many years ago and the wounds will heal however he will pick at the scabs and reopen the areas. He currently denies signs of infection. He keeps the areas covered. He has compression stockings but does not wear them. 4/24; patient presents for follow-up. We have been using Hydrofera Blue with antibiotic ointment under compression therapy. His wounds have  healed. Electronic Signature(s) Signed: 06/30/2022 9:36:24 AM By: Geralyn Corwin DO Entered By: Geralyn Corwin on 06/30/2022 08:55:05 -------------------------------------------------------------------------------- Physical Exam Details Patient Name: Date of Service: Shawn Wagner Shawn Wagner. 06/30/2022 8:00 A M Medical Record Number: [id number] Patient Account Number: [id number] Date of Birth/Sex: Treating RN: 10/22/1950 (71 y.o. Shawn Wagner Primary Care Provider: Cleon Dew Other Clinician: Referring Provider: Treating Provider/Extender: Tori Milks in Treatment: 1 Constitutional . Cardiovascular . Psychiatric . Shawn Wagner ([id number]) [id number] Page 2 of 5 Notes Right lower extremity: T the anterior aspect there is epithelialization to the previous wound sites. No signs of infection. Good edema control. Hemosiderin o staining to the anterior aspect. Electronic Signature(s) Signed: 06/30/2022 9:36:24 AM By: Geralyn Corwin DO Entered By: Geralyn Corwin on 06/30/2022 08:55:43 -------------------------------------------------------------------------------- Physician Orders Details Patient Name: Date of Service: Shawn Sheela Stack, Shawn Wagner Shawn Wagner. 06/30/2022 8:00 A M Medical Record Number: [id number] Patient Account Number: [id number] Date of Birth/Sex: Treating RN: Sep 02, 1950 (71 y.o. Shawn Wagner Primary Care Provider: Cleon Dew Other Clinician: Referring Provider: Treating Provider/Extender: Tori Milks in Treatment: 1 Verbal / Phone Orders: No Diagnosis Coding Discharge From [facility] Services Discharge from Wound Care Center Treatment Complete Wear compression garments daily. Put garments on first thing when you wake up and remove them before bed. Moisturize legs daily after removing compression garments. Electronic Signature(s) Signed: 06/30/2022 9:36:24 AM By: Geralyn Corwin DO Entered By: Geralyn Corwin on 06/30/2022 08:58:40 -------------------------------------------------------------------------------- Problem List Details Patient Name: Date of Service: Shawn Sheela Stack, Shawn Wagner Shawn Wagner. 06/30/2022 8:00 A M Medical Record Number: [id number] Patient Account Number: [id number] Date of Birth/Sex: Treating RN: [birth year] (71 y.o. Shawn Wagner Primary Care Provider: Cleon Dew Other Clinician: Referring Provider: Treating Provider/Extender: Tori Milks in Treatment: 1 Active Problems ICD-10 Encounter Code Description Active Date MDM Diagnosis [phone] Non-pressure chronic ulcer of  other part of right lower leg with fat layer 06/23/2022 No Yes exposed I87.311 Chronic venous hypertension (idiopathic) with ulcer of right lower extremity 06/23/2022 No Yes E11.622 Type 2 diabetes mellitus with other skin ulcer 06/23/2022 No Yes Inactive Problems Resolved Problems Electronic Signature(s) Signed: 06/30/2022 9:36:24 AM By: Geralyn Corwin DO Entered By: Geralyn Corwin on 06/30/2022 08:54:35 Shawn Wagner ([id number]) [id number] Page 3 of 5 -------------------------------------------------------------------------------- Progress Note Details Patient Name: Date of Service: Shawn Wagner Shawn Wagner. 06/30/2022 8:00 A M Medical Record Number: [id number] Patient Account Number: [id number] Date of Birth/Sex: Treating RN: Jun 12, 1950 (72 y.o. Shawn Wagner) Shawn Wagner Primary Care Provider: Cleon Dew Other Clinician: Referring Provider: Treating Provider/Extender: Tori Milks in Treatment: 1 Subjective Chief Complaint Information obtained from Patient 06/23/2022; right lower extremity wound History of Present Illness (HPI) 06/23/2022 Shawn Wagner is a 72 year old male with a past medical history of chronic pain syndrome, CAD, CVA, Chronic venous insufficiency and type 2  diabetes That presents to the clinic for a several year history of wounds to his right lower extremity. He states that this started out as a MRSA infection many years ago and the wounds will heal however he will pick at the scabs and reopen the areas. He currently denies signs of infection. He keeps the areas covered. He has compression stockings but does not wear them. 4/24; patient presents for follow-up. We have been using Hydrofera Blue with antibiotic ointment under compression therapy. His wounds have healed. Objective Constitutional Vitals Time Taken: 8:08 AM, Height: 73 in, Weight: 182 lbs, BMI: 24, Temperature: 98 F, Pulse: 69 bpm, Respiratory Rate: 18 breaths/min, Blood Pressure: 163/79 mmHg. General Notes: Right lower extremity: T the anterior aspect there is epithelialization to the previous wound sites. No signs of infection. Good edema control. o Hemosiderin staining to the anterior aspect. Integumentary (Hair, Skin) Wound #1 status is Open. Original cause of wound was Gradually Appeared. The date acquired was: 03/08/2016. The wound has been in treatment 1 weeks. The wound is located on the Right Lower Leg. The wound measures 0cm length x 0cm width x 0cm depth; 0cm^2 area and 0cm^3 volume. There is no tunneling or undermining noted. There is a none present amount of drainage noted. There is no granulation within the wound bed. There is no necrotic tissue within the wound bed. Assessment Active Problems ICD-10 Non-pressure chronic ulcer of other part of right lower leg with fat layer exposed Chronic venous hypertension (idiopathic) with ulcer of right lower extremity Type 2 diabetes mellitus with other skin ulcer Patient has done well with Hydrofera Blue and compression therapy. His wounds of healed. I recommended wearing compression stockings daily. We gave him information to order new ones. We gave him Tubigrip in office to use temporarily. He knows to put these on in the  morning and take them off at night. He knows to call with any questions or concerns. He may follow-up as needed. Plan Discharge From [facility] Services: Discharge from Wound Care Center Treatment Complete Wear compression garments daily. Put garments on first thing when you wake up and remove them before bed. Moisturize legs daily after removing compression garments. 1. Follow-up as needed 2. Discharge from clinic due to closed wound LUPE, BONNER ([id number]) [id number] Page 4 of 5 3. Compression stockings daily Electronic Signature(s) Signed: 06/30/2022 9:36:24 AM By: Geralyn Corwin DO Entered By: Geralyn Corwin on 06/30/2022 08:58:16 -------------------------------------------------------------------------------- ROS/PFSH Details Patient Name: Date of Service: Shawn Wagner Shawn Wagner. 06/30/2022  8:00 A M Medical Record Number: [id number] Patient Account Number: [id number] Date of Birth/Sex: Treating RN: 09/17/1950 (71 y.o. Shawn Wagner) Shawn Wagner Primary Care Provider: Cleon Dew Other Clinician: Referring Provider: Treating Provider/Extender: Tori Milks in Treatment: 1 Cardiovascular Medical History: Positive for: Coronary Artery Disease; Hypertension Endocrine Medical History: Positive for: Type II Diabetes Time with diabetes: 26 Treated with: Insulin Immunizations Pneumococcal Vaccine: Received Pneumococcal Vaccination: No Implantable Devices No devices added Family and Social History Never smoker; Marital Status - Divorced; Alcohol Use: Never; Drug Use: No History; Caffeine Use: Daily Electronic Signature(s) Signed: 06/30/2022 9:36:24 AM By: Geralyn Corwin DO Signed: 07/01/2022 3:34:54 PM By: Shawn Pax RN Entered By: Geralyn Corwin on 06/30/2022 08:58:53 -------------------------------------------------------------------------------- SuperBill Details Patient Name: Date of Service: Shawn Wagner Shawn Wagner.  06/30/2022 Medical Record Number: [id number] Patient Account Number: [id number] Date of Birth/Sex: Treating RN: 1950-10-13 (71 y.o. Shawn Wagner Primary Care Provider: Cleon Dew Other Clinician: Referring Provider: Treating Provider/Extender: Tori Milks in Treatment: 1 Diagnosis Coding ICD-10 Codes Code Description [phone] Non-pressure chronic ulcer of other part of right lower leg with fat layer exposed I87.311 Chronic venous hypertension (idiopathic) with ulcer of right lower extremity E11.622 Type 2 diabetes mellitus with other skin ulcer Facility Procedures : NIHAL, MARZELLA Code: [id number] SOVEREIGN RAMIRO ([id number]) Description: [phone] - WOUND CARE VISIT-LEV 2 EST PT [id number] Modifier: [id number] Quantity: 1 [id number] Page 5 of 5 Physician Procedures : CPT4 Code Description Modifier [phone] - WC PHYS LEVEL 3 - EST PT ICD-10 Diagnosis Description L97.812 Non-pressure chronic ulcer of other part of right lower leg with fat layer exposed I87.311 Chronic venous hypertension (idiopathic) with ulcer  of right lower extremity E11.622 Type 2 diabetes mellitus with other skin ulcer Quantity: 1 Electronic Signature(s) Signed: 06/30/2022 9:36:24 AM By: Geralyn Corwin DO Entered By: Geralyn Corwin on 06/30/2022 08:58:34

## 2022-07-02 NOTE — Progress Notes (Signed)
JUVON, TEATER D ([id number]) [id number] Page 1 of 7 Visit Report for 06/30/2022 Arrival Information Details Patient Name: Date of Service: ZA CHA Wyona Almas MES D. 06/30/2022 8:00 A M Medical Record Number: [id number] Patient Account Number: [id number] Date of Birth/Sex: Treating RN: 06/19/1950 (72 y.o. [name]) Jettie Pagan, Lyla Son Primary Care Hanalei Glace: Cleon Dew Other Clinician: Referring Jeannine Pennisi: Treating Jovee Dettinger/Extender: Tori Milks in Treatment: 1 Visit Information History Since Last Visit Added or deleted any medications: No Patient Arrived: Ambulatory Any new allergies or adverse reactions: No Arrival Time: 08:04 Had a fall or experienced change in No Accompanied By: friend activities of daily living that may affect Transfer Assistance: None risk of falls: Patient Identification Verified: Yes Signs or symptoms of abuse/neglect since last visito No Secondary Verification Process Completed: Yes Hospitalized since last visit: No Patient Requires Transmission-Based Precautions: No Implantable device outside of the clinic excluding No Patient Has Alerts: No cellular tissue based products placed in the center since last visit: Has Dressing in Place as Prescribed: Yes Pain Present Now: No Electronic Signature(s) Signed: 07/01/2022 3:34:54 PM By: Yevonne Pax RN Entered By: Yevonne Pax on 06/30/2022 08:07:59 -------------------------------------------------------------------------------- Clinic Level of Care Assessment Details Patient Name: Date of Service: [name] MES D. 06/30/2022 8:00 A M Medical Record Number: [id number] Patient Account Number: [id number] Date of Birth/Sex: Treating RN: Apr 12, 1950 (72 y.o. [name]) Yevonne Pax Primary Care Nadiah Corbit: Cleon Dew Other Clinician: Referring Kristeena Meineke: Treating Roann Merk/Extender: Tori Milks in Treatment: 1 Clinic Level of Care Assessment  Items TOOL 4 Quantity Score X- 1 0 Use when only an EandM is performed on FOLLOW-UP visit ASSESSMENTS - Nursing Assessment / Reassessment X- 1 10 Reassessment of Co-morbidities (includes updates in patient status) X- 1 5 Reassessment of Adherence to Treatment Plan ASSESSMENTS - Wound and Skin A ssessment / Reassessment X - Simple Wound Assessment / Reassessment - one wound 1 5 []  - 0 Complex Wound Assessment / Reassessment - multiple wounds []  - 0 Dermatologic / Skin Assessment (not related to wound area) ASSESSMENTS - Focused Assessment []  - 0 Circumferential Edema Measurements - multi extremities []  - 0 Nutritional Assessment / Counseling / Intervention []  - 0 Lower Extremity Assessment (monofilament, tuning fork, pulses) []  - 0 Peripheral Arterial Disease Assessment (using hand held doppler) ASSESSMENTS - Ostomy and/or Continence Assessment and Care []  - 0 Incontinence Assessment and Management []  - 0 Ostomy Care Assessment and Management (repouching, etc.) PROCESS - Coordination of Care X - Simple Patient / Family Education for ongoing care 1 [address] D ([id number]) [phone].pdf Page 2 of 7 []  - 0 Complex (extensive) Patient / Family Education for ongoing care []  - 0 Staff obtains Chiropractor, Records, T Results / Process Orders est []  - 0 Staff telephones HHA, Nursing Homes / Clarify orders / etc []  - 0 Routine Transfer to another Facility (non-emergent condition) []  - 0 Routine Hospital Admission (non-emergent condition) []  - 0 New Admissions / Manufacturing engineer / Ordering NPWT Apligraf, etc. , []  - 0 Emergency Hospital Admission (emergent condition) X- 1 10 Simple Discharge Coordination []  - 0 Complex (extensive) Discharge Coordination PROCESS - Special Needs []  - 0 Pediatric / Minor Patient Management []  - 0 Isolation Patient Management []  - 0 Hearing / Language / Visual special needs []  - 0 Assessment of  Community assistance (transportation, D/C planning, etc.) []  - 0 Additional assistance / Altered mentation []  - 0 Support Surface(s) Assessment (bed, cushion, seat, etc.) INTERVENTIONS - Wound Cleansing / Measurement  X - Simple Wound Cleansing - one wound 1 5 []  - 0 Complex Wound Cleansing - multiple wounds X- 1 5 Wound Imaging (photographs - any number of wounds) []  - 0 Wound Tracing (instead of photographs) X- 1 5 Simple Wound Measurement - one wound []  - 0 Complex Wound Measurement - multiple wounds INTERVENTIONS - Wound Dressings []  - 0 Small Wound Dressing one or multiple wounds []  - 0 Medium Wound Dressing one or multiple wounds []  - 0 Large Wound Dressing one or multiple wounds []  - 0 Application of Medications - topical []  - 0 Application of Medications - injection INTERVENTIONS - Miscellaneous []  - 0 External ear exam []  - 0 Specimen Collection (cultures, biopsies, blood, body fluids, etc.) []  - 0 Specimen(s) / Culture(s) sent or taken to Lab for analysis []  - 0 Patient Transfer (multiple staff / Nurse, adult / Similar devices) []  - 0 Simple Staple / Suture removal (25 or less) []  - 0 Complex Staple / Suture removal (26 or more) []  - 0 Hypo / Hyperglycemic Management (close monitor of Blood Glucose) []  - 0 Ankle / Brachial Index (ABI) - do not check if billed separately X- 1 5 Vital Signs Has the patient been seen at the hospital within the last three years: Yes Total Score: 65 Level Of Care: New/Established - Level 2 Electronic Signature(s) Signed: 07/01/2022 3:34:54 PM By: Yevonne Pax RN Entered By: Yevonne Pax on 06/30/2022 08:44:52 [name] ([id number]) [id number] Page 3 of 7 -------------------------------------------------------------------------------- Encounter Discharge Information Details Patient Name: Date of Service: [name] MES D. 06/30/2022 8:00 A M Medical Record Number: [id number] Patient Account  Number: [id number] Date of Birth/Sex: Treating RN: 30-Oct-1950 (72 y.o. [name]) Yevonne Pax Primary Care Jeanean Hollett: Cleon Dew Other Clinician: Referring Parthena Fergeson: Treating Heru Montz/Extender: Tori Milks in Treatment: 1 Encounter Discharge Information Items Discharge Condition: Stable Ambulatory Status: Ambulatory Discharge Destination: Home Transportation: Private Auto Accompanied By: friend Schedule Follow-up Appointment: Yes Clinical Summary of Care: Electronic Signature(s) Signed: 07/01/2022 3:34:54 PM By: Yevonne Pax RN Entered By: Yevonne Pax on 06/30/2022 08:46:17 -------------------------------------------------------------------------------- Lower Extremity Assessment Details Patient Name: Date of Service: [name] MES D. 06/30/2022 8:00 A M Medical Record Number: [id number] Patient Account Number: [id number] Date of Birth/Sex: Treating RN: 1950/12/06 (71 y.o. [name] Primary Care Adaleah Forget: Cleon Dew Other Clinician: Referring Mackinzee Roszak: Treating Alandra Sando/Extender: Tori Milks in Treatment: 1 Edema Assessment Assessed: [Left: No] [Right: No] Edema: [Left: N] [Right: o] Calf Left: Right: Point of Measurement: 36 cm From Medial Instep 30 cm Ankle Left: Right: Point of Measurement: 12 cm From Medial Instep 20 cm Knee To Floor Left: Right: From Medial Instep 46 cm Vascular Assessment Pulses: Dorsalis Pedis Palpable: [Right:Yes] Electronic Signature(s) Signed: 07/01/2022 3:34:54 PM By: Yevonne Pax RN Entered By: Yevonne Pax on 06/30/2022 08:16:18 -------------------------------------------------------------------------------- Multi Wound Chart Details Patient Name: Date of Service: [name], JA MES D. 06/30/2022 8:00 A TADAO, EMIG D ([id number]) [phone].pdf Page 4 of 7 Medical Record Number: [id number] Patient Account Number: [id number] Date of  Birth/Sex: Treating RN: [birth year] (72 y.o. [name]) Yevonne Pax Primary Care Edith Groleau: Cleon Dew Other Clinician: Referring Latiesha Harada: Treating Mishel Sans/Extender: Tori Milks in Treatment: 1 Vital Signs Height(in): 73 Pulse(bpm): 69 Weight(lbs): 182 Blood Pressure(mmHg): 163/79 Body Mass Index(BMI): 24 Temperature(F): 98 Respiratory Rate(breaths/min): 18 [1:Photos:] [N/A:N/A] Right Lower Leg N/A N/A Wound Location: Gradually Appeared N/A N/A Wounding Event: Diabetic Wound/Ulcer of the Lower N/A N/A Primary  Etiology: Extremity Coronary Artery Disease, N/A N/A Comorbid History: Hypertension, Type II Diabetes 03/08/2016 N/A N/A Date Acquired: 1 N/A N/A Weeks of Treatment: Open N/A N/A Wound Status: No N/A N/A Wound Recurrence: Yes N/A N/A Clustered Wound: 0x0x0 N/A N/A Measurements L x W x D (cm) 0 N/A N/A A (cm) : rea 0 N/A N/A Volume (cm) : 100.00% N/A N/A % Reduction in A rea: 100.00% N/A N/A % Reduction in Volume: Grade 1 N/A N/A Classification: None Present N/A N/A Exudate A mount: None Present (0%) N/A N/A Granulation A mount: None Present (0%) N/A N/A Necrotic A mount: Fascia: No N/A N/A Exposed Structures: Fat Layer (Subcutaneous Tissue): No Tendon: No Muscle: No Joint: No Bone: No Large (67-100%) N/A N/A Epithelialization: Treatment Notes Electronic Signature(s) Signed: 07/01/2022 3:34:54 PM By: Yevonne Pax RN Entered By: Yevonne Pax on 06/30/2022 08:43:28 -------------------------------------------------------------------------------- Multi-Disciplinary Care Plan Details Patient Name: Date of Service: [name], Fawn Kirk MES D. 06/30/2022 8:00 A M Medical Record Number: [id number] Patient Account Number: [id number] Date of Birth/Sex: Treating RN: 1950-06-28 (71 y.o. [name] Primary Care Kawthar Ennen: Cleon Dew Other Clinician: Referring Mintie Witherington: Treating Laural Eiland/Extender: Tori Milks in Treatment: 1 Active Inactive Electronic Signature(s) MUHAMMADALI, RIES ([id number]) [phone].pdf Page 5 of 7 Signed: 07/01/2022 3:34:54 PM By: Yevonne Pax RN Entered By: Yevonne Pax on 06/30/2022 08:45:16 -------------------------------------------------------------------------------- Pain Assessment Details Patient Name: Date of Service: [name] MES D. 06/30/2022 8:00 A M Medical Record Number: [id number] Patient Account Number: [id number] Date of Birth/Sex: Treating RN: 09-03-1950 (72 y.o. [name]) Yevonne Pax Primary Care Kadeidra Coryell: Cleon Dew Other Clinician: Referring Maudine Kluesner: Treating Angelea Penny/Extender: Tori Milks in Treatment: 1 Active Problems Location of Pain Severity and Description of Pain Patient Has Paino No Site Locations Pain Management and Medication Current Pain Management: Electronic Signature(s) Signed: 07/01/2022 3:34:54 PM By: Yevonne Pax RN Entered By: Yevonne Pax on 06/30/2022 08:08:34 -------------------------------------------------------------------------------- Patient/Caregiver Education Details Patient Name: Date of Service: [name] MES D. 4/24/2024andnbsp8:00 A M Medical Record Number: [id number] Patient Account Number: [id number] Date of Birth/Gender: Treating RN: March 17, 1950 (71 y.o. [name] Primary Care Physician: Cleon Dew Other Clinician: Referring Physician: Treating Physician/Extender: Tori Milks in Treatment: 1 Education Assessment Education Provided To: Patient Education Topics Provided Wound/Skin Impairment: Handouts: Caring for Your Ulcer Methods: Explain/Verbal Responses: State content correctly Electronic Signature(s) Signed: 07/01/2022 3:34:54 PM By: Yevonne Pax RN KHALI, ALBANESE ([id number]) PM By: Yevonne Pax RN [phone].pdf Page 6 of 7 Signed:  07/01/2022 3:34:54 Entered By: Yevonne Pax on 06/30/2022 08:45:31 -------------------------------------------------------------------------------- Wound Assessment Details Patient Name: Date of Service: [name] MES D. 06/30/2022 8:00 A M Medical Record Number: [id number] Patient Account Number: [id number] Date of Birth/Sex: Treating RN: 1950/06/24 (72 y.o. [name]) Yevonne Pax Primary Care Lyly Canizales: Cleon Dew Other Clinician: Referring Rondell Frick: Treating Geneva Barrero/Extender: Tori Milks in Treatment: 1 Wound Status Wound Number: 1 Primary Etiology: Diabetic Wound/Ulcer of the Lower Extremity Wound Location: Right Lower Leg Wound Status: Open Wounding Event: Gradually Appeared Comorbid History: Coronary Artery Disease, Hypertension, Type II Diabetes Date Acquired: 03/08/2016 Weeks Of Treatment: 1 Clustered Wound: Yes Photos Wound Measurements Length: (cm) Width: (cm) Depth: (cm) Area: (cm) Volume: (cm) 0 % Reduction in Area: 100% 0 % Reduction in Volume: 100% 0 Epithelialization: Large (67-100%) 0 Tunneling: No 0 Undermining: No Wound Description Classification: Grade 1 Exudate Amount: None Present Foul Odor After Cleansing: No Slough/Fibrino No Wound Bed Granulation  Amount: None Present (0%) Exposed Structure Necrotic Amount: None Present (0%) Fascia Exposed: No Fat Layer (Subcutaneous Tissue) Exposed: No Tendon Exposed: No Muscle Exposed: No Joint Exposed: No Bone Exposed: No Electronic Signature(s) Signed: 07/01/2022 3:34:54 PM By: Yevonne Pax RN Entered By: Yevonne Pax on 06/30/2022 08:43:12 -------------------------------------------------------------------------------- Vitals Details Patient Name: Date of Service: [name], Fawn Kirk MES D. 06/30/2022 8:00 A M Medical Record Number: [id number] Patient Account Number: [id number] Date of Birth/Sex: Treating RN: [birth year] (71 y.o. [name] Primary Care Kreed Kauffman:  Cleon Dew Other Clinician: Referring Gustava Berland: Treating Michal Callicott/Extender: Tori Milks in Treatment: 1 BEAUX, WEDEMEYER ([id number]) [phone].pdf Page 7 of 7 Vital Signs Time Taken: 08:08 Temperature (F): 98 Height (in): 73 Pulse (bpm): 69 Weight (lbs): 182 Respiratory Rate (breaths/min): 18 Body Mass Index (BMI): 24 Blood Pressure (mmHg): 163/79 Reference Range: 80 - 120 mg / dl Electronic Signature(s) Signed: 07/01/2022 3:34:54 PM By: Yevonne Pax RN Entered By: Yevonne Pax on 06/30/2022 32:35:57

## 2022-08-18 DIAGNOSIS — R0602 Shortness of breath: Secondary | ICD-10-CM | POA: Insufficient documentation

## 2022-09-14 ENCOUNTER — Ambulatory Visit
Admission: EM | Admit: 2022-09-14 | Discharge: 2022-09-14 | Disposition: A | Discharge status: Home / Self Care | Payer: Medicare HMO | Attending: Emergency Medicine | Admitting: Emergency Medicine

## 2022-09-14 DIAGNOSIS — L03317 Cellulitis of buttock: Secondary | ICD-10-CM

## 2022-09-14 MED ORDER — DOXYCYCLINE HYCLATE 100 MG PO CAPS: 100.0000 mg | ORAL_CAPSULE | Freq: Two times a day (BID) | ORAL | 0 refills | Status: DC

## 2022-09-14 MED ORDER — OXYCODONE-ACETAMINOPHEN 5-325 MG PO TABS: 1.0000 | ORAL_TABLET | Freq: Four times a day (QID) | ORAL | 0 refills | Status: AC | PRN

## 2022-09-14 NOTE — ED Provider Notes (Signed)
[facility]    CSN: [id number] Arrival date & time: 09/14/22  1419      History   Chief Complaint No chief complaint on file.   HPI Shawn Wagner is a 72 y.o. male.   HPI  59-year-old male with a past medical history significant for chronic pain syndrome, coronary artery disease, CVA, diabetes, and diabetic neuropathy presents for evaluation of possible abscess on the left butt cheek in the gluteal cleft that is been present for the last week.  He denies any fever or drainage from the area.  He states that he is having a lot of pain and it is difficult for him to sit.  Past Medical History:  Diagnosis Date   Diabetes mellitus without complication [facility])     Patient Active Problem List   Diagnosis Date Noted   Chronic pain syndrome 07/25/2019   Pharmacologic therapy 07/25/2019   Disorder of skeletal system 07/25/2019   Problems influencing health status 07/25/2019   Neurogenic pruritus 07/17/2019   Chronic pruritic rash in adult 07/10/2019   Cerebrovascular accident (CVA) (HCC) 03/28/2018   Altered mental status 03/15/2018   History of shingles 03/15/2018   Diabetic neuropathy, painful (HCC) 06/21/2016   Neurodermatitis 06/21/2016   Neuropathy 05/24/2016   Arachnoid cyst 01/21/2016   Pulmonary nodule 01/21/2016   Dupuytren's contracture of both hands 06/04/2013   CAD (coronary artery disease) 08/24/2012   Sebaceous cyst 06/19/2010   Depressive disorder 06/07/2010   Low back pain 06/07/2010   Lichenification and lichen simplex chronicus 04/29/2010   Pityriasis versicolor 07/11/2009   Diabetes mellitus (HCC) 05/31/2008   Hypercholesteremia 05/31/2008   Hypertension, benign 05/31/2008    History reviewed. No pertinent surgical history.     Home Medications    Prior to Admission medications   Medication Sig Start Date End Date Taking? Authorizing Provider  aspirin 81 MG EC tablet Take by mouth. 10/24/09  Yes [provider]  blood glucose  meter kit and supplies KIT Dispense based on patient and insurance preference. Use up to four times daily as directed. (FOR ICD-9 250.00, 250.01). 01/07/15  Yes Darien Ramus, MD  doxycycline (VIBRAMYCIN) 100 MG capsule Take 1 capsule (100 mg total) by mouth 2 (two) times daily. 09/14/22  Yes Becky Augusta, NP  fluticasone Aleda Grana) 50 MCG/ACT nasal spray  03/08/19  Yes [provider]  gabapentin (NEURONTIN) 300 MG capsule Take 300 mg by mouth. 01/27/16  Yes [provider]  glucose blood (ACCU-CHEK GUIDE) test strip by Other route Three (3) times a day before meals. Accu-chek Guide. Dx. E11.65. 05/15/18  Yes [provider]  insulin aspart (NOVOLOG) 100 UNIT/ML injection Inject into the skin.   Yes [provider]  metoprolol tartrate (LOPRESSOR) 25 MG tablet Take by mouth. 03/28/18  Yes [provider]  oxyCODONE-acetaminophen (PERCOCET/ROXICET) 5-325 MG tablet Take 1 tablet by mouth every 6 (six) hours as needed for severe pain. 09/14/22  Yes Becky Augusta, NP  traZODone (DESYREL) 50 MG tablet  05/15/18  Yes [provider]  atorvastatin (LIPITOR) 80 MG tablet Take by mouth. 03/28/18 03/28/19  [provider]  DULoxetine (CYMBALTA) 30 MG capsule Take by mouth. Patient not taking: Reported on 07/22/2020    [provider]  FLUoxetine (PROZAC) 10 MG capsule Take 10 mg by mouth. 01/21/16 05/24/18  [provider]  glipiZIDE (GLUCOTROL) 5 MG tablet Take 5 mg by mouth. 01/21/16 05/24/18  [provider]  insulin NPH Human (HUMULIN N,NOVOLIN N) 100  UNIT/ML injection Inject into the skin. 01/27/16 05/24/18  [provider]  insulin NPH-regular Human (NOVOLIN 70/30) (70-30) 100 UNIT/ML injection Take 30 units in the morning before breakfast and 30 units in the evening before dinner. 01/30/19   [provider]  lisinopril (PRINIVIL,ZESTRIL) 2.5 MG tablet Take by mouth. Patient not taking: Reported on 05/31/2022  03/28/18   [provider]  loratadine (CLARITIN) 10 MG tablet Take by mouth. 06/13/18 06/13/19  [provider]  losartan (COZAAR) 25 MG tablet Take 25 mg by mouth daily. Patient not taking: Reported on 07/22/2020 01/17/19   [provider]  metFORMIN (GLUCOPHAGE) 1000 MG tablet Take 1 tablet (1,000 mg total) by mouth 2 (two) times daily with a meal. 01/02/15 05/24/18  Sharyn Creamer, MD  omega-3 acid ethyl esters (LOVAZA) 1 g capsule Take by mouth. 11/06/18 11/06/19  [provider]  omeprazole (PRILOSEC) 20 MG capsule  04/13/18   [provider]  terbinafine (LAMISIL) 250 MG tablet  05/02/18   [provider]    Family History History reviewed. No pertinent family history.  Social History Social History   Tobacco Use   Smoking status: Never   Smokeless tobacco: Never  Substance Use Topics   Alcohol use: No   Drug use: Never     Allergies   Vicodin [hydrocodone-acetaminophen]   Review of Systems Review of Systems  Constitutional:  Negative for fever.  Skin:  Positive for color change.       Swollen, tender area on right buttock in gluteal cleft.     Physical Exam Triage Vital Signs ED Triage Vitals  Enc Vitals Group     BP      Pulse      Resp      Temp      Temp src      SpO2      Weight      Height      Head Circumference      Peak Flow      Pain Score      Pain Loc      Pain Edu?      Excl. in GC?    No data found.  Updated Vital Signs BP (S) (!) 184/105 (BP Location: Left Arm)   Pulse 76   Temp 98.2 F (36.8 C) (Oral)   SpO2 96%   Visual Acuity Right Eye Distance:   Left Eye Distance:   Bilateral Distance:    Right Eye Near:   Left Eye Near:    Bilateral Near:     Physical Exam Vitals and nursing note reviewed.  Constitutional:      Appearance: Normal appearance.  Skin:    General: Skin is warm and dry.     Capillary Refill: Capillary refill takes less than 2 seconds.     Findings: Erythema  and lesion present.  Neurological:     General: No focal deficit present.     Mental Status: He is alert and oriented to person, place, and time.      UC Treatments / Results  Labs (all labs ordered are listed, but only abnormal results are displayed) Labs Reviewed - No data to display  EKG   Radiology No results found.  Procedures Procedures (including critical care time)  Medications Ordered in UC Medications - No data to display  Initial Impression / Assessment and Plan / UC Course  I have reviewed the triage vital signs and the nursing notes.  Pertinent labs &  imaging results that were available during my care of the patient were reviewed by me and considered in my medical decision making (see chart for details).  Patient is a very pleasant, nontoxic-appearing 72 year old male presenting for evaluation of abscess on his left buttock in the gluteal cleft that is been present for the last week.  As you can see in image above the tissue is erythematous and edematous.  It is markedly tender to touch but it is firm.  There is no fluctuance noted.  This does appear to be an immature abscess.  Patient has been taking amoxicillin that he is prescribed for his teeth.  He is taken 3 doses without any improvement of symptoms.  He has also been sitting in warm baths without improvement of symptoms.  I will start the patient on doxycycline 100 mg twice daily for 10 days for cover for potential staph and strep.  I we will advise him to continue to apply warm compresses to see if he can get the lesion to come to ahead and rupture on its own.  He may also use a donut cushion to help take pressure off of his buttock and maybe aid in pain relief.  Additionally, I will prescribe Percocet that he can use every 6 hours as needed for pain.  The patient has an allergy to Vicodin.  His allergy is that the medication causes itching.  I advised him that this is a normal response to narcotic pain medication  and he can use over-the-counter antihistamines if he develops any itching from the Percocet.   Final Clinical Impressions(s) / UC Diagnoses   Final diagnoses:  Cellulitis of left buttock     Discharge Instructions      Continue to apply warm compresses, or sit in hot baths, to try and coax the lesion on your left buttock to come to ahead and rupture on its own.  Take the doxycycline twice daily with food for 10 days for treatment of the cellulitis, soft tissue skin infection, causing your developing abscess.  Be mindful that doxycycline will make you more prone to sunburn and you need to wear sunscreen when you are outdoors and reapply it frequently.  You may use the Percocet as needed for pain.  1 tablet every 6 hours.  This may cause itching, similar to the Vicodin, and you can use over-the-counter Benadryl, Claritin, or Zyrtec to help with itching.  Be mindful it will also make you sleepy so do not drink alcohol or drive if you take it.  If you develop any worsening pain, increased redness or swelling, or fever you need to return for reevaluation or seek care in the ER.     ED Prescriptions     Medication Sig Dispense Auth. Provider   doxycycline (VIBRAMYCIN) 100 MG capsule Take 1 capsule (100 mg total) by mouth 2 (two) times daily. 20 capsule Becky Augusta, NP   oxyCODONE-acetaminophen (PERCOCET/ROXICET) 5-325 MG tablet Take 1 tablet by mouth every 6 (six) hours as needed for severe pain. 15 tablet Becky Augusta, NP      I have reviewed the PDMP during this encounter.   Becky Augusta, NP 09/14/22 1517

## 2022-09-14 NOTE — ED Triage Notes (Addendum)
Pt presents to UC c/o possible abscess in between bot\tom x1 week, hard to sit. Pt states he tried to get in with PCP but they couldn't see him until the 16th.

## 2022-09-14 NOTE — Discharge Instructions (Addendum)
Continue to apply warm compresses, or sit in hot baths, to try and coax the lesion on your left buttock to come to ahead and rupture on its own.  Take the doxycycline twice daily with food for 10 days for treatment of the cellulitis, soft tissue skin infection, causing your developing abscess.  Be mindful that doxycycline will make you more prone to sunburn and you need to wear sunscreen when you are outdoors and reapply it frequently.  You may use the Percocet as needed for pain.  1 tablet every 6 hours.  This may cause itching, similar to the Vicodin, and you can use over-the-counter Benadryl, Claritin, or Zyrtec to help with itching.  Be mindful it will also make you sleepy so do not drink alcohol or drive if you take it.  If you develop any worsening pain, increased redness or swelling, or fever you need to return for reevaluation or seek care in the ER.

## 2022-10-19 ENCOUNTER — Ambulatory Visit
Admission: EM | Admit: 2022-10-19 | Discharge: 2022-10-19 | Disposition: A | Discharge status: Home / Self Care | Payer: Medicare HMO

## 2022-10-19 ENCOUNTER — Other Ambulatory Visit: Payer: Self-pay

## 2022-10-19 DIAGNOSIS — Z8614 Personal history of Methicillin resistant Staphylococcus aureus infection: Secondary | ICD-10-CM | POA: Diagnosis not present

## 2022-10-19 DIAGNOSIS — L02212 Cutaneous abscess of back [any part, except buttock]: Secondary | ICD-10-CM

## 2022-10-19 DIAGNOSIS — M79632 Pain in left forearm: Secondary | ICD-10-CM

## 2022-10-19 MED ORDER — DOXYCYCLINE HYCLATE 100 MG PO CAPS: 100.0000 mg | ORAL_CAPSULE | Freq: Two times a day (BID) | ORAL | 0 refills | Status: AC

## 2022-10-19 MED ORDER — OXYCODONE HCL 5 MG PO TABS
5.0000 mg | ORAL_TABLET | Freq: Four times a day (QID) | ORAL | 0 refills | Status: AC | PRN
Start: 2022-10-19 — End: 2022-10-22

## 2022-10-19 NOTE — Discharge Instructions (Addendum)
-  Your abscesses are likely MRSA given your history.  I sent doxycycline, a longer course. - I also sent something for pain. - Continue with warm soaks and hot compresses.  This may cause the abscesses to come to ahead and drain pus. - You should return if you are having no improvement in your symptoms or symptoms worsen or you develop fever.

## 2022-10-19 NOTE — ED Provider Notes (Signed)
[facility]    CSN: [id number] Arrival date & time: 10/19/22  1246      History   Chief Complaint Chief Complaint  Patient presents with   Abscess   Arm Pain    HPI Shawn Wagner is a 72 y.o. male presenting for 1 week history of 4 raised, erythematous and painful areas of the right lower back. Patient seen in this department 5 weeks ago for cellulitis of buttocks and treated with doxycycline.  He says within a 2 days of taking the doxycycline (which he took for 10 days) he started to feel better and the cellulitis of the gluteal cleft resolved.  He does have a history of MRSA and says the bumps of the right lower back look consistent with a MRSA infection.  He has been soaking in warm water and using warm compresses as well as taking OTC meds for pain and discomfort.  No associated fever.  No drainage noted.  Patient also reports intermittent pain over the past 1 week of the left forearm.  He denies any injury.  Pain is mostly of the dorsal forearm.  Pain is worse with palpating the forearm.  He has full range of motion and does not really have any pain with movement.  He has not been treating this in any way.  Not reporting any pain in his chest, palpitations, shortness of breath, dizziness, fatigue, weakness.  No report of any headaches, confusion.  HPI  Past Medical History:  Diagnosis Date   Diabetes mellitus without complication [facility])     Patient Active Problem List   Diagnosis Date Noted   Chronic pain syndrome 07/25/2019   Pharmacologic therapy 07/25/2019   Disorder of skeletal system 07/25/2019   Problems influencing health status 07/25/2019   Neurogenic pruritus 07/17/2019   Chronic pruritic rash in adult 07/10/2019   Cerebrovascular accident (CVA) (HCC) 03/28/2018   Altered mental status 03/15/2018   History of shingles 03/15/2018   Diabetic neuropathy, painful (HCC) 06/21/2016   Neurodermatitis 06/21/2016   Neuropathy 05/24/2016   Arachnoid cyst  01/21/2016   Pulmonary nodule 01/21/2016   Dupuytren's contracture of both hands 06/04/2013   CAD (coronary artery disease) 08/24/2012   Sebaceous cyst 06/19/2010   Depressive disorder 06/07/2010   Low back pain 06/07/2010   Lichenification and lichen simplex chronicus 04/29/2010   Pityriasis versicolor 07/11/2009   Diabetes mellitus (HCC) 05/31/2008   Hypercholesteremia 05/31/2008   Hypertension, benign 05/31/2008    History reviewed. No pertinent surgical history.     Home Medications    Prior to Admission medications   Medication Sig Start Date End Date Taking? Authorizing Provider  acetaminophen (TYLENOL) 500 MG tablet Take by mouth. 11/19/21  Yes [provider]  amoxicillin (AMOXIL) 500 MG capsule Take 1,000 mg by mouth 2 (two) times daily. 09/01/22  Yes [provider]  aspirin 81 MG chewable tablet Chew by mouth. 08/22/22  Yes [provider]  blood glucose meter kit and supplies Use as instructed. Please dispense a glucometer (old glucometer is not working). 08/30/22  Yes [provider]  Continuous Glucose Receiver (DEXCOM G6 RECEIVER) DEVI Dispense DexCom G6 Receiver 07/26/22  Yes [provider]  Continuous Glucose Sensor (DEXCOM G6 SENSOR) MISC Dispense DexCom G6 sensors; Use 1 sensor q 10 days 07/26/22  Yes [provider]  Continuous Glucose Transmitter (DEXCOM G6 TRANSMITTER) MISC DexCom G6 transmitter; Use 1 every 90 days 07/26/22  Yes [provider]  doxepin (SINEQUAN) 10 MG capsule  Take by mouth. 10/07/22 10/07/23 Yes [provider]  doxycycline (VIBRAMYCIN) 100 MG capsule Take 1 capsule (100 mg total) by mouth 2 (two) times daily for 14 days. 10/19/22 11/02/22 Yes Shirlee Latch, PA-C  hydrOXYzine (ATARAX) 25 MG tablet Take 25 mg by mouth daily. 06/22/22  Yes [provider]  LANTUS 100 UNIT/ML injection Inject into the skin. 08/26/22  Yes [provider]  oxyCODONE (ROXICODONE) 5 MG  immediate release tablet Take 1 tablet (5 mg total) by mouth every 6 (six) hours as needed for up to 3 days for severe pain. 10/19/22 10/22/22 Yes Shirlee Latch, PA-C  traZODone (DESYREL) 100 MG tablet Take by mouth. 10/07/22 11/06/22 Yes [provider]  triamcinolone ointment (KENALOG) 0.1 % Apply topically. 10/07/22 10/07/23 Yes [provider]  Zinc Oxide 40 % PSTE Apply topically. 08/21/22  Yes [provider]  ascorbic acid (VITAMIN C) 1000 MG tablet Take 1 tablet by mouth daily.    [provider]  aspirin 81 MG EC tablet Take by mouth. 10/24/09   [provider]  atorvastatin (LIPITOR) 80 MG tablet Take by mouth. 03/28/18 03/28/19  [provider]  blood glucose meter kit and supplies KIT Dispense based on patient and insurance preference. Use up to four times daily as directed. (FOR ICD-9 250.00, 250.01). 01/07/15   Darien Ramus, MD  DULoxetine (CYMBALTA) 30 MG capsule Take by mouth. Patient not taking: Reported on 07/22/2020    [provider]  FLUoxetine (PROZAC) 10 MG capsule Take 10 mg by mouth. 01/21/16 05/24/18  [provider]  fluticasone Aleda Grana) 50 MCG/ACT nasal spray  03/08/19   [provider]  gabapentin (NEURONTIN) 300 MG capsule Take 300 mg by mouth. 01/27/16   [provider]  glipiZIDE (GLUCOTROL) 5 MG tablet Take 5 mg by mouth. 01/21/16 05/24/18  [provider]  glucose blood (ACCU-CHEK GUIDE) test strip by Other route Three (3) times a day before meals. Accu-chek Guide. Dx. E11.65. 05/15/18   [provider]  insulin aspart (NOVOLOG) 100 UNIT/ML injection Inject into the skin.    [provider]  insulin NPH Human (HUMULIN N,NOVOLIN N) 100 UNIT/ML injection Inject into the skin. 01/27/16 05/24/18  [provider]  insulin NPH-regular Human (NOVOLIN 70/30) (70-30) 100 UNIT/ML injection Take 30 units in the morning before breakfast and 30 units in the evening  before dinner. 01/30/19   [provider]  lisinopril (PRINIVIL,ZESTRIL) 2.5 MG tablet Take by mouth. Patient not taking: Reported on 05/31/2022 03/28/18   [provider]  loratadine (CLARITIN) 10 MG tablet Take by mouth. 06/13/18 06/13/19  [provider]  losartan (COZAAR) 25 MG tablet Take 25 mg by mouth daily. Patient not taking: Reported on 07/22/2020 01/17/19   [provider]  metFORMIN (GLUCOPHAGE) 1000 MG tablet Take 1 tablet (1,000 mg total) by mouth 2 (two) times daily with a meal. 01/02/15 05/24/18  Sharyn Creamer, MD  metoprolol tartrate (LOPRESSOR) 25 MG tablet Take by mouth. 03/28/18   [provider]  omega-3 acid ethyl esters (LOVAZA) 1 g capsule Take by mouth. 11/06/18 11/06/19  [provider]  omeprazole (PRILOSEC) 20 MG capsule  04/13/18   [provider]  oxyCODONE-acetaminophen (PERCOCET/ROXICET) 5-325 MG tablet Take 1 tablet by mouth every 6 (six) hours as needed for severe pain. 09/14/22   Becky Augusta, NP  terbinafine (LAMISIL) 250 MG tablet  05/02/18   [provider]  traZODone (DESYREL) 50 MG tablet  05/15/18  [provider]    Family History Family History  Problem Relation Age of Onset   Emphysema Mother    Prostate cancer Father     Social History Social History   Tobacco Use   Smoking status: Never   Smokeless tobacco: Never  Substance Use Topics   Alcohol use: No   Drug use: Never     Allergies   Heparin, Vicodin [hydrocodone-acetaminophen], and Hydrocodone-acetaminophen   Review of Systems Review of Systems  Constitutional:  Negative for fatigue and fever.  Respiratory:  Negative for shortness of breath.   Cardiovascular:  Negative for chest pain and palpitations.  Musculoskeletal:  Positive for back pain. Negative for gait problem.  Skin:  Positive for color change. Negative for wound.  Neurological:  Negative for dizziness, weakness and headaches.  Psychiatric/Behavioral:   Negative for confusion.      Physical Exam Triage Vital Signs ED Triage Vitals  Encounter Vitals Group     BP      Systolic BP Percentile      Diastolic BP Percentile      Pulse      Resp      Temp      Temp src      SpO2      Weight      Height      Head Circumference      Peak Flow      Pain Score      Pain Loc      Pain Education      Exclude from Growth Chart    No data found.  Updated Vital Signs BP (!) 155/72 (BP Location: Left Arm)   Pulse 79   Temp 98.4 F (36.9 C) (Oral)   Resp 18   SpO2 100%   Physical Exam Vitals and nursing note reviewed.  Constitutional:      General: He is not in acute distress.    Appearance: Normal appearance. He is well-developed. He is not ill-appearing.  HENT:     Head: Normocephalic and atraumatic.  Eyes:     General: No scleral icterus.    Conjunctiva/sclera: Conjunctivae normal.  Cardiovascular:     Rate and Rhythm: Normal rate and regular rhythm.  Pulmonary:     Effort: Pulmonary effort is normal. No respiratory distress.     Breath sounds: No wheezing, rhonchi or rales.  Musculoskeletal:        General: Tenderness present.     Cervical back: Neck supple.     Comments: LEFT FOREARM: Tenderness is throughout the medial extensor tendons of the forearm.  Full range of motion of arm.  No swelling, ecchymosis, abscesses, abrasions.  Skin:    General: Skin is warm and dry.     Capillary Refill: Capillary refill takes less than 2 seconds.     Findings: Erythema present.     Comments: There are 4 lesions of the right lower back.  There is 1 large erythematous lesion with darkened center that measures approximately 4 cm x 3 cm.  It is indurated without fluctuance.  The other lesions are about a centimeter in diameter.  All areas are very tender to palpation.  Neurological:     General: No focal deficit present.     Mental Status: He is alert. Mental status is at baseline.     Cranial Nerves: No cranial nerve deficit (grossly  intact).     Motor: No weakness.     Gait: Gait normal.  Psychiatric:  Mood and Affect: Mood normal.      UC Treatments / Results  Labs (all labs ordered are listed, but only abnormal results are displayed) Labs Reviewed - No data to display  EKG   Radiology No results found.  Procedures Procedures (including critical care time)  Medications Ordered in UC Medications - No data to display  Initial Impression / Assessment and Plan / UC Course  I have reviewed the triage vital signs and the nursing notes.  Pertinent labs & imaging results that were available during my care of the patient were reviewed by me and considered in my medical decision making (see chart for details).   72 year old male presents for 2 separate complaints.  First he reports painful lesions of the right lower back for the past 1 week.  Recently treated for cellulitis of buttocks last month with doxycycline.  He reports that infection cleared up within a few days.  He does have history of MRSA.  He also reports left forearm pain.  Denies injury.  No chest pain, palpitations, shortness of breath.  On exam he has an area of induration without fluctuance measuring 3 cm x 4 cm and 3 small 1 cm in diameter lesions.  All areas are erythematous with darkened center.  Suspicious for staph/MRSA.  Will treat with extended course of doxycycline x 14 days.  Patient given oxycodone for severe pain.  He was prescribed at previous visit as he has allergy to Norco.  Tolerated well.  Patient advised to return if no improvement in a few days or if symptoms acutely worsen.  Likely tendinitis of forearm.  Advised Voltaren topical, the pain medication, cryotherapy, RICE guidelines.  Reviewed going to ER for any associated severe headaches, vision changes, confusion, facial drooping, feeling faint, chest pain, palpitations, shortness of breath.  Advised to follow-up with Ortho if not improving in the next 1 week.   Final  Clinical Impressions(s) / UC Diagnoses   Final diagnoses:  Abscess of back  History of MRSA infection  Left forearm pain     Discharge Instructions      -Your abscesses are likely MRSA given your history.  I sent doxycycline, a longer course. - I also sent something for pain. - Continue with warm soaks and hot compresses.  This may cause the abscesses to come to ahead and drain pus. - You should return if you are having no improvement in your symptoms or symptoms worsen or you develop fever.     ED Prescriptions     Medication Sig Dispense Auth. Provider   doxycycline (VIBRAMYCIN) 100 MG capsule Take 1 capsule (100 mg total) by mouth 2 (two) times daily for 14 days. 28 capsule Eusebio Friendly B, PA-C   oxyCODONE (ROXICODONE) 5 MG immediate release tablet Take 1 tablet (5 mg total) by mouth every 6 (six) hours as needed for up to 3 days for severe pain. 12 tablet Shirlee Latch, PA-C      I have reviewed the PDMP during this encounter.   Shirlee Latch, PA-C 10/19/22 [phone]

## 2022-10-19 NOTE — ED Triage Notes (Addendum)
Pt is here with 3 abscess on his right side of tailbone for a week now. Pt has not taken any meds to relieve discomfort. Pt states his left forearm throbs randomly

## 2022-12-22 ENCOUNTER — Encounter: Payer: Medicare HMO | Attending: Internal Medicine | Admitting: Internal Medicine

## 2022-12-22 DIAGNOSIS — L97812 Non-pressure chronic ulcer of other part of right lower leg with fat layer exposed: Secondary | ICD-10-CM | POA: Diagnosis not present

## 2022-12-22 DIAGNOSIS — Z09 Encounter for follow-up examination after completed treatment for conditions other than malignant neoplasm: Secondary | ICD-10-CM | POA: Diagnosis not present

## 2022-12-22 DIAGNOSIS — L97821 Non-pressure chronic ulcer of other part of left lower leg limited to breakdown of skin: Secondary | ICD-10-CM | POA: Insufficient documentation

## 2022-12-22 DIAGNOSIS — I87333 Chronic venous hypertension (idiopathic) with ulcer and inflammation of bilateral lower extremity: Secondary | ICD-10-CM | POA: Insufficient documentation

## 2022-12-24 NOTE — Progress Notes (Signed)
His significant other says that he will often pick at his skin and the resultant scabs. The largest area here is on the right lateral lower leg just to the edge of an extreme area of scar tissue apparently from her previous MRSA infection. There is no evidence of concurrent infection currently. Electronic Signature(s) Signed: 12/22/2022 4:42:07 PM By: Baltazar Najjar MD Entered By: Baltazar Najjar on 12/22/2022 11:22:10 -------------------------------------------------------------------------------- Physician Orders Details Patient Name: Date of Service: Shawn Wagner, Shawn Wagner. 12/22/2022 1:30 PM Medical Record Number: [id number] Patient Account Number: [id number] Date of Birth/Sex: Treating RN: 1950/10/21 (72 y.o. Shawn Wagner Primary Care Provider: Cleon Dew Other Clinician: Referring Provider: Treating Provider/Extender: RO BSO N, MICHA EL G Self, Referral Weeks in Treatment: 0 Verbal / Phone Orders: No Diagnosis Coding Follow-up Appointments Return Appointment in 1 week. Shawn Wagner, Shawn Wagner ([id number]) [id number] Page 3 of 7 Edema Control - Lymphedema / Segmental Compressive Device / Other UrgoK2 LITE - left lower leg with AandD ointment and TCA Elevate, Exercise Daily and A void Standing for Long Periods of Time. Elevate legs to the level of the heart and pump ankles as often as possible Elevate leg(s) parallel to the floor when sitting. Wound Treatment Wound #2 - Lower Leg Wound Laterality: Right, Lateral Cleanser: Soap and Water 1 x Per Week/30 Days Discharge Instructions: Gently cleanse wound with antibacterial soap, rinse and pat dry prior to dressing wounds Cleanser: Wound Cleanser 1 x Per Week/30  Days Discharge Instructions: Wash your hands with soap and water. Remove old dressing, discard into plastic bag and place into trash. Cleanse the wound with Wound Cleanser prior to applying a clean dressing using gauze sponges, not tissues or cotton balls. Do not scrub or use excessive force. Pat dry using gauze sponges, not tissue or cotton balls. Peri-Wound Care: AandD Ointment 1 x Per Week/30 Days Discharge Instructions: Apply AandD Ointment as directed Peri-Wound Care: Triamcinolone Acetonide Cream, 0.1%, 15 (g) tube 1 x Per Week/30 Days Discharge Instructions: Apply as directed. Prim Dressing: Hydrofera Blue Ready Transfer Foam, 2.5x2.5 (in/in) 1 x Per Week/30 Days ary Discharge Instructions: Apply Hydrofera Blue Ready to wound bed as directed Secondary Dressing: Gauze 1 x Per Week/30 Days Discharge Instructions: As directed: dry, moistened with saline or moistened with Dakins Solution Compression Wrap: Urgo K2 Lite, two layer compression system, regular 1 x Per Week/30 Days Electronic Signature(s) Signed: 12/22/2022 4:42:07 PM By: Baltazar Najjar MD Signed: 12/24/2022 11:41:18 AM By: Shawn Pax RN Entered By: Shawn Wagner on 12/22/2022 11:10:25 -------------------------------------------------------------------------------- Problem List Details Patient Name: Date of Service: Shawn Wagner, Shawn Wagner. 12/22/2022 1:30 PM Medical Record Number: [id number] Patient Account Number: [id number] Date of Birth/Sex: Treating RN: 05/18/1950 (72 y.o. Shawn Wagner) Shawn Wagner Primary Care Provider: Cleon Dew Other Clinician: Referring Provider: Treating Provider/Extender: RO BSO N, MICHA EL G Self, Referral Weeks in Treatment: 0 Active Problems ICD-10 Encounter Code Description Active Date MDM Diagnosis I87.333 Chronic venous hypertension (idiopathic) with ulcer and inflammation of 12/22/2022 No Yes bilateral lower extremity L97.811 Non-pressure chronic ulcer of other part of right lower leg  limited to breakdown 12/22/2022 No Yes of skin L97.821 Non-pressure chronic ulcer of other part of left lower leg limited to breakdown 12/22/2022 No Yes Shawn Wagner, Shawn Wagner ([id number]) [id number].pdf Page 4 of 7 of skin Inactive Problems Resolved Problems Electronic Signature(s) Signed: 12/22/2022 4:42:07 PM By: Baltazar Najjar MD Entered By: Baltazar Najjar on 12/22/2022 11:17:30 -------------------------------------------------------------------------------- Progress Note Details Patient Name: Date of Service: Shawn Wagner,  Devices No devices added Family and Social History Never smoker; Marital Status - Divorced; Alcohol Use: Never; Drug Use: No History; Caffeine Use: Daily Electronic Signature(s) Signed: 12/22/2022 4:42:07 PM By: Baltazar Najjar MD Signed: 12/24/2022 11:41:18 AM By: Shawn Pax RN Entered By: Shawn Wagner on 12/22/2022 10:43:35 -------------------------------------------------------------------------------- SuperBill Details Patient Name: Date of Service: Shawn Wagner, Shawn Wagner. 12/22/2022 Medical Record Number: [id number] Patient Account Number: [id number] Date of Birth/Sex: Treating RN: 12-30-1950 (72 y.o. Shawn Wagner Primary Care Provider: Cleon Dew Other Clinician: Kem Kays ([id number]) [id number] Page 7 of 7 Referring Provider: Treating Provider/Extender: RO BSO N, MICHA EL G Self, Referral Weeks in Treatment: 0 Diagnosis Coding ICD-10 Codes Code Description I87.333 Chronic venous hypertension (idiopathic) with ulcer and inflammation of bilateral lower extremity L97.811 Non-pressure chronic ulcer of other part of right lower leg limited to breakdown of skin L97.821 Non-pressure chronic ulcer of other part of left lower leg limited to breakdown of skin Facility Procedures : CPT4 Code: 01027253 Description: 99213 - WOUND CARE VISIT-LEV 3 EST PT Modifier: Quantity:  1 Physician Procedures : CPT4 Code Description Modifier [id number] 99214 - WC PHYS LEVEL 4 - EST PT ICD-10 Diagnosis Description I87.333 Chronic venous hypertension (idiopathic) with ulcer and inflammation of bilateral lower extremity L97.811 Non-pressure chronic ulcer of other  part of right lower leg limited to breakdown of skin L97.821 Non-pressure chronic ulcer of other part of left lower leg limited to breakdown of skin Quantity: 1 Electronic Signature(s) Signed: 12/23/2022 10:34:07 AM By: Shawn Pax RN Previous Signature: 12/22/2022 4:42:07 PM Version By: Baltazar Najjar MD Entered By: Shawn Wagner on 12/23/2022 07:34:07  Shawn Wagner. 12/22/2022 1:30 PM Medical Record Number: [id number] Patient Account Number: [id number] Date of Birth/Sex: Treating RN: 08/21/1950 (72 y.o. Shawn Wagner) Shawn Wagner Primary Care Provider: Cleon Dew Other Clinician: Referring Provider: Treating Provider/Extender: RO BSO N, MICHA EL G Self, Referral Weeks in Treatment: 0 Subjective Chief Complaint Information obtained from Patient 06/23/2022; right lower extremity wound 12/22/2022; patient returns to clinic for review predominantly of an area on his right lateral lower leg History of Present Illness (HPI) 06/23/2022 Mr. Shawn Wagner is a 72 year old male with a past medical history of chronic pain syndrome, CAD, CVA, Chronic venous insufficiency and type 2 diabetes That presents to the clinic for a several year history of wounds to his right lower extremity. He states that this started out as a MRSA infection many years ago and the wounds will heal however he will pick at the scabs and reopen the areas. He currently denies signs of infection. He keeps the areas covered. He has compression stockings but does not wear them. 4/24; patient presents for follow-up. We have been using Hydrofera Blue with antibiotic ointment under compression therapy. His wounds have healed. READMISSION 12/22/2022 This patient returns to the clinic today with an area predominantly on his right lower leg however  he also has eschared areas bilaterally. He has not been moisturizing his skin and he has not been wearing his compression stockings apparently wore them for about 2 days when he left the clinic last time in April. He apparently pulled some skin from the lateral part of his right leg that is the only wound that looks concerning here. Right next to this area is a large scar which he says was from a MRSA infection years ago. He is a diabetic with very significant peripheral neuropathy although he states he does not have pain or pruritus in his legs. His past medical history is reviewed. He is a type II diabetic with peripheral neuropathy chronic venous insufficiency, hypertension, mitral valve regurgitation, Wagner III chronic renal failure and a history of coronary artery disease Patient History Allergies hydrocodone, Vicodin, heparin Social History Never smoker, Marital Status - Divorced, Alcohol Use - Never, Drug Use - No History, Caffeine Use - Daily. Medical History Cardiovascular Patient has history of Coronary Artery Disease, Hypertension Endocrine Patient has history of Type II Diabetes Shawn Wagner, Shawn Wagner ([id number]) [id number] Page 5 of 7 Objective Constitutional Sitting or standing Blood Pressure is within target range for patient.. Pulse regular and within target range for patient.Marland Kitchen Respirations regular, non-labored and within target range.. Temperature is normal and within the target range for the patient.. Vitals Time Taken: 1:42 PM, Height: 73 in, Source: Stated, Weight: 187 lbs, Source: Stated, BMI: 24.7, Temperature: 97.7 F, Pulse: 109 bpm, Respiratory Rate: 18 breaths/min, Blood Pressure: 136/78 mmHg. Cardiovascular Pedal pulses are palpable. Edema present in both extremities.Hemosiderin deposition bilaterally.. General Notes: Multiple pocked areas on the right and left upper lower legs. His significant other says that he will often pick at his skin  and the resultant scabs. The largest area here is on the right lateral lower leg just to the edge of an extreme area of scar tissue apparently from her previous MRSA infection. There is no evidence of concurrent infection currently. Integumentary (Hair, Skin) Wound #2 status is Open. Original cause of wound was Trauma. The date acquired was: 12/20/2022. The wound is located on the Right,Lateral Lower Leg. The wound measures 3.5cm length x 2.5cm width x 0.2cm depth; 6.872cm^2 area and 1.374cm^3 volume. There is Fat  Shawn Wagner. 12/22/2022 1:30 PM Medical Record Number: [id number] Patient Account Number: [id number] Date of Birth/Sex: Treating RN: 08/21/1950 (72 y.o. Shawn Wagner) Shawn Wagner Primary Care Provider: Cleon Dew Other Clinician: Referring Provider: Treating Provider/Extender: RO BSO N, MICHA EL G Self, Referral Weeks in Treatment: 0 Subjective Chief Complaint Information obtained from Patient 06/23/2022; right lower extremity wound 12/22/2022; patient returns to clinic for review predominantly of an area on his right lateral lower leg History of Present Illness (HPI) 06/23/2022 Mr. Shawn Wagner is a 72 year old male with a past medical history of chronic pain syndrome, CAD, CVA, Chronic venous insufficiency and type 2 diabetes That presents to the clinic for a several year history of wounds to his right lower extremity. He states that this started out as a MRSA infection many years ago and the wounds will heal however he will pick at the scabs and reopen the areas. He currently denies signs of infection. He keeps the areas covered. He has compression stockings but does not wear them. 4/24; patient presents for follow-up. We have been using Hydrofera Blue with antibiotic ointment under compression therapy. His wounds have healed. READMISSION 12/22/2022 This patient returns to the clinic today with an area predominantly on his right lower leg however  he also has eschared areas bilaterally. He has not been moisturizing his skin and he has not been wearing his compression stockings apparently wore them for about 2 days when he left the clinic last time in April. He apparently pulled some skin from the lateral part of his right leg that is the only wound that looks concerning here. Right next to this area is a large scar which he says was from a MRSA infection years ago. He is a diabetic with very significant peripheral neuropathy although he states he does not have pain or pruritus in his legs. His past medical history is reviewed. He is a type II diabetic with peripheral neuropathy chronic venous insufficiency, hypertension, mitral valve regurgitation, Wagner III chronic renal failure and a history of coronary artery disease Patient History Allergies hydrocodone, Vicodin, heparin Social History Never smoker, Marital Status - Divorced, Alcohol Use - Never, Drug Use - No History, Caffeine Use - Daily. Medical History Cardiovascular Patient has history of Coronary Artery Disease, Hypertension Endocrine Patient has history of Type II Diabetes Shawn Wagner, Shawn Wagner ([id number]) [id number] Page 5 of 7 Objective Constitutional Sitting or standing Blood Pressure is within target range for patient.. Pulse regular and within target range for patient.Marland Kitchen Respirations regular, non-labored and within target range.. Temperature is normal and within the target range for the patient.. Vitals Time Taken: 1:42 PM, Height: 73 in, Source: Stated, Weight: 187 lbs, Source: Stated, BMI: 24.7, Temperature: 97.7 F, Pulse: 109 bpm, Respiratory Rate: 18 breaths/min, Blood Pressure: 136/78 mmHg. Cardiovascular Pedal pulses are palpable. Edema present in both extremities.Hemosiderin deposition bilaterally.. General Notes: Multiple pocked areas on the right and left upper lower legs. His significant other says that he will often pick at his skin  and the resultant scabs. The largest area here is on the right lateral lower leg just to the edge of an extreme area of scar tissue apparently from her previous MRSA infection. There is no evidence of concurrent infection currently. Integumentary (Hair, Skin) Wound #2 status is Open. Original cause of wound was Trauma. The date acquired was: 12/20/2022. The wound is located on the Right,Lateral Lower Leg. The wound measures 3.5cm length x 2.5cm width x 0.2cm depth; 6.872cm^2 area and 1.374cm^3 volume. There is Fat  Shawn Wagner, Shawn Wagner ([id number]) [id number] Page 1 of 7 Visit Report for 12/22/2022 Chief Complaint Document Details Patient Name: Date of Service: Shawn CHA Wyona Almas MES Wagner. 12/22/2022 1:30 PM Medical Record Number: [id number] Patient Account Number: [id number] Date of Birth/Sex: Treating RN: 03/16/1950 (72 y.o. Shawn Wagner) Shawn Wagner Primary Care Provider: Cleon Dew Other Clinician: Referring Provider: Treating Provider/Extender: RO BSO Dorris Carnes, MICHA EL G Self, Referral Weeks in Treatment: 0 Information Obtained from: Patient Chief Complaint 06/23/2022; right lower extremity wound 12/22/2022; patient returns to clinic for review predominantly of an area on his right lateral lower leg Electronic Signature(s) Signed: 12/22/2022 4:42:07 PM By: Baltazar Najjar MD Entered By: Baltazar Najjar on 12/22/2022 11:18:08 -------------------------------------------------------------------------------- HPI Details Patient Name: Date of Service: Shawn Wagner, Shawn Wagner. 12/22/2022 1:30 PM Medical Record Number: [id number] Patient Account Number: [id number] Date of Birth/Sex: Treating RN: [birth year] (72 y.o. Shawn Wagner Primary Care Provider: Cleon Dew Other Clinician: Referring Provider: Treating Provider/Extender: RO BSO N, MICHA EL G Self, Referral Weeks in Treatment: 0 History of Present Illness HPI Description: 06/23/2022 Mr. Jannie Wenrick is a 72 year old male with a past medical history of chronic pain syndrome, CAD, CVA, Chronic venous insufficiency and type 2 diabetes That presents to the clinic for a several year history of wounds to his right lower extremity. He states that this started out as a MRSA infection many years ago and the wounds will heal however he will pick at the scabs and reopen the areas. He currently denies signs of infection. He keeps the areas covered. He has compression stockings but does not wear them. 4/24; patient presents for follow-up. We  have been using Hydrofera Blue with antibiotic ointment under compression therapy. His wounds have healed. READMISSION 12/22/2022 This patient returns to the clinic today with an area predominantly on his right lower leg however he also has eschared areas bilaterally. He has not been moisturizing his skin and he has not been wearing his compression stockings apparently wore them for about 2 days when he left the clinic last time in April. He apparently pulled some skin from the lateral part of his right leg that is the only wound that looks concerning here. Right next to this area is a large scar which he says was from a MRSA infection years ago. He is a diabetic with very significant peripheral neuropathy although he states he does not have pain or pruritus in his legs. Shawn Wagner, Shawn Wagner ([id number]) [id number] Page 2 of 7 His past medical history is reviewed. He is a type II diabetic with peripheral neuropathy chronic venous insufficiency, hypertension, mitral valve regurgitation, Wagner III chronic renal failure and a history of coronary artery disease Electronic Signature(s) Signed: 12/22/2022 4:42:07 PM By: Baltazar Najjar MD Entered By: Baltazar Najjar on 12/22/2022 11:20:08 -------------------------------------------------------------------------------- Physical Exam Details Patient Name: Date of Service: Shawn Wagner, Shawn Wagner. 12/22/2022 1:30 PM Medical Record Number: [id number] Patient Account Number: [id number] Date of Birth/Sex: Treating RN: [birth year] (72 y.o. Shawn Wagner Primary Care Provider: Cleon Dew Other Clinician: Referring Provider: Treating Provider/Extender: RO BSO N, MICHA EL G Self, Referral Weeks in Treatment: 0 Constitutional Sitting or standing Blood Pressure is within target range for patient.. Pulse regular and within target range for patient.Marland Kitchen Respirations regular, non-labored and within target range.. Temperature is normal  and within the target range for the patient.. Cardiovascular Pedal pulses are palpable. Edema present in both extremities.Hemosiderin deposition bilaterally.. Notes Multiple pocked areas on the right and left upper lower legs.

## 2022-12-24 NOTE — Progress Notes (Signed)
Schedule Follow-up Appointment: Yes Clinical Summary of Care: Electronic Signature(s) Shawn Wagner, Shawn Wagner ([id number]) [id number] Page 5 of 11 Signed: 12/22/2022 2:42:11 PM By: Yevonne Pax RN Entered By: Yevonne Pax on 12/22/2022 11:42:11 -------------------------------------------------------------------------------- Lower Extremity Assessment Details Patient Name: Date of Service: Shawn Wagner. 12/22/2022 1:30 PM Medical Record Number: [id number] Patient Account Number: [id number] Date of Birth/Sex: Treating RN: 19-May-1950 (72 y.o. [name]) Yevonne Pax Primary Care Lacharles Altschuler: Cleon Dew Other Clinician: Referring Zeven Kocak: Treating Lorena Benham/Extender: RO BSO N, MICHA EL G Self, Referral Weeks in Treatment: 0 Edema Assessment Assessed: [Left: No] [Right: No] Edema: [Left: Ye] [Right: s] Calf Left: Right: Point of Measurement: 36 cm From Medial Instep 36 cm Ankle Left: Right: Point of Measurement: 10 cm From Medial Instep 21.5 cm Knee To Floor Left: Right: From Medial Instep 46 cm Vascular Assessment Pulses: Dorsalis Pedis Palpable: [Right:Yes] Extremity colors, hair growth, and conditions: Extremity Color: [Right:Hyperpigmented] Hair Growth on Extremity:  [Right:No] Temperature of Extremity: [Right:Warm] Capillary Refill: [Right:< 3 seconds] Dependent Rubor: [Right:No] Blanched when Elevated: [Right:No Yes] Toe Nail Assessment Left: Right: Thick: Yes Discolored: Yes Deformed: Yes Improper Length and Hygiene: Yes Electronic Signature(s) Signed: 12/24/2022 11:41:18 AM By: Yevonne Pax RN Entered By: Yevonne Pax on 12/22/2022 10:58:07 Shawn Wagner ([id number]) [id number] Page 6 of 11 -------------------------------------------------------------------------------- Multi Wound Chart Details Patient Name: Date of Service: Shawn Wagner. 12/22/2022 1:30 PM Medical Record Number: [id number] Patient Account Number: [id number] Date of Birth/Sex: Treating RN: 24-Oct-1950 (72 y.o. [name]) Yevonne Pax Primary Care Srinivas Lippman: Cleon Dew Other Clinician: Referring Quenisha Lovins: Treating Dalisha Shively/Extender: RO BSO N, MICHA EL G Self, Referral Weeks in Treatment: 0 Vital Signs Height(in): 73 Pulse(bpm): 109 Weight(lbs): 187 Blood Pressure(mmHg): 136/78 Body Mass Index(BMI): 24.7 Temperature(F): 97.7 Respiratory Rate(breaths/min): 18 [2:Photos:] [N/A:N/A] Right, Lateral Lower Leg N/A N/A Wound Location: Trauma N/A N/A Wounding Event: Trauma, Other N/A N/A Primary Etiology: Coronary Artery Disease, N/A N/A Comorbid History: Hypertension, Type II Diabetes 12/20/2022 N/A N/A Date Acquired: 0 N/A N/A Weeks of Treatment: Open N/A N/A Wound Status: No N/A N/A Wound Recurrence: 3.5x2.5x0.2 N/A N/A Measurements L x W x Wagner (cm) 6.872 N/A N/A A (cm) : rea 1.374 N/A N/A Volume (cm) : Full Thickness Without Exposed N/A N/A Classification: Support Structures Medium N/A N/A Exudate Amount: Serosanguineous N/A N/A Exudate Type: red, brown N/A N/A Exudate Color: Large (67-100%) N/A N/A Granulation Amount: Red N/A N/A Granulation Quality: None Present (0%) N/A N/A Necrotic Amount: Fat Layer  (Subcutaneous Tissue): Yes N/A N/A Exposed Structures: Fascia: No Tendon: No Muscle: No Joint: No Bone: No None N/A N/A Epithelialization: Treatment Notes Electronic Signature(s) Signed: 12/22/2022 4:42:07 PM By: Baltazar Najjar MD Entered By: Baltazar Najjar on 12/22/2022 11:17:36 Shawn Wagner ([id number]) [id number] Page 7 of 11 -------------------------------------------------------------------------------- Multi-Disciplinary Care Plan Details Patient Name: Date of Service: Shawn Wagner. 12/22/2022 1:30 PM Medical Record Number: [id number] Patient Account Number: [id number] Date of Birth/Sex: Treating RN: July 01, 1950 (72 y.o. [name]) Yevonne Pax Primary Care Loan Oguin: Cleon Dew Other Clinician: Referring Zaron Zwiefelhofer: Treating Brandun Pinn/Extender: RO BSO N, MICHA EL G Self, Referral Weeks in Treatment: 0 Active Inactive Necrotic Tissue Nursing Diagnoses: Knowledge deficit related to management of necrotic/devitalized tissue Goals: Patient/caregiver will verbalize understanding of reason and process for debridement of necrotic tissue Date Initiated: 12/22/2022 Target Resolution Date: 01/22/2023 Goal Status: Active Interventions: Assess patient pain level pre-, during and post procedure and prior to discharge Notes: Wound/Skin Impairment Nursing Diagnoses: Knowledge deficit related to ulceration/compromised skin  Schedule Follow-up Appointment: Yes Clinical Summary of Care: Electronic Signature(s) Shawn Wagner, Shawn Wagner ([id number]) [id number] Page 5 of 11 Signed: 12/22/2022 2:42:11 PM By: Yevonne Pax RN Entered By: Yevonne Pax on 12/22/2022 11:42:11 -------------------------------------------------------------------------------- Lower Extremity Assessment Details Patient Name: Date of Service: Shawn Wagner. 12/22/2022 1:30 PM Medical Record Number: [id number] Patient Account Number: [id number] Date of Birth/Sex: Treating RN: 19-May-1950 (72 y.o. [name]) Yevonne Pax Primary Care Lacharles Altschuler: Cleon Dew Other Clinician: Referring Zeven Kocak: Treating Lorena Benham/Extender: RO BSO N, MICHA EL G Self, Referral Weeks in Treatment: 0 Edema Assessment Assessed: [Left: No] [Right: No] Edema: [Left: Ye] [Right: s] Calf Left: Right: Point of Measurement: 36 cm From Medial Instep 36 cm Ankle Left: Right: Point of Measurement: 10 cm From Medial Instep 21.5 cm Knee To Floor Left: Right: From Medial Instep 46 cm Vascular Assessment Pulses: Dorsalis Pedis Palpable: [Right:Yes] Extremity colors, hair growth, and conditions: Extremity Color: [Right:Hyperpigmented] Hair Growth on Extremity:  [Right:No] Temperature of Extremity: [Right:Warm] Capillary Refill: [Right:< 3 seconds] Dependent Rubor: [Right:No] Blanched when Elevated: [Right:No Yes] Toe Nail Assessment Left: Right: Thick: Yes Discolored: Yes Deformed: Yes Improper Length and Hygiene: Yes Electronic Signature(s) Signed: 12/24/2022 11:41:18 AM By: Yevonne Pax RN Entered By: Yevonne Pax on 12/22/2022 10:58:07 Shawn Wagner ([id number]) [id number] Page 6 of 11 -------------------------------------------------------------------------------- Multi Wound Chart Details Patient Name: Date of Service: Shawn Wagner. 12/22/2022 1:30 PM Medical Record Number: [id number] Patient Account Number: [id number] Date of Birth/Sex: Treating RN: 24-Oct-1950 (72 y.o. [name]) Yevonne Pax Primary Care Srinivas Lippman: Cleon Dew Other Clinician: Referring Quenisha Lovins: Treating Dalisha Shively/Extender: RO BSO N, MICHA EL G Self, Referral Weeks in Treatment: 0 Vital Signs Height(in): 73 Pulse(bpm): 109 Weight(lbs): 187 Blood Pressure(mmHg): 136/78 Body Mass Index(BMI): 24.7 Temperature(F): 97.7 Respiratory Rate(breaths/min): 18 [2:Photos:] [N/A:N/A] Right, Lateral Lower Leg N/A N/A Wound Location: Trauma N/A N/A Wounding Event: Trauma, Other N/A N/A Primary Etiology: Coronary Artery Disease, N/A N/A Comorbid History: Hypertension, Type II Diabetes 12/20/2022 N/A N/A Date Acquired: 0 N/A N/A Weeks of Treatment: Open N/A N/A Wound Status: No N/A N/A Wound Recurrence: 3.5x2.5x0.2 N/A N/A Measurements L x W x Wagner (cm) 6.872 N/A N/A A (cm) : rea 1.374 N/A N/A Volume (cm) : Full Thickness Without Exposed N/A N/A Classification: Support Structures Medium N/A N/A Exudate Amount: Serosanguineous N/A N/A Exudate Type: red, brown N/A N/A Exudate Color: Large (67-100%) N/A N/A Granulation Amount: Red N/A N/A Granulation Quality: None Present (0%) N/A N/A Necrotic Amount: Fat Layer  (Subcutaneous Tissue): Yes N/A N/A Exposed Structures: Fascia: No Tendon: No Muscle: No Joint: No Bone: No None N/A N/A Epithelialization: Treatment Notes Electronic Signature(s) Signed: 12/22/2022 4:42:07 PM By: Baltazar Najjar MD Entered By: Baltazar Najjar on 12/22/2022 11:17:36 Shawn Wagner ([id number]) [id number] Page 7 of 11 -------------------------------------------------------------------------------- Multi-Disciplinary Care Plan Details Patient Name: Date of Service: Shawn Wagner. 12/22/2022 1:30 PM Medical Record Number: [id number] Patient Account Number: [id number] Date of Birth/Sex: Treating RN: July 01, 1950 (72 y.o. [name]) Yevonne Pax Primary Care Loan Oguin: Cleon Dew Other Clinician: Referring Zaron Zwiefelhofer: Treating Brandun Pinn/Extender: RO BSO N, MICHA EL G Self, Referral Weeks in Treatment: 0 Active Inactive Necrotic Tissue Nursing Diagnoses: Knowledge deficit related to management of necrotic/devitalized tissue Goals: Patient/caregiver will verbalize understanding of reason and process for debridement of necrotic tissue Date Initiated: 12/22/2022 Target Resolution Date: 01/22/2023 Goal Status: Active Interventions: Assess patient pain level pre-, during and post procedure and prior to discharge Notes: Wound/Skin Impairment Nursing Diagnoses: Knowledge deficit related to ulceration/compromised skin  soap, rinse and pat dry prior to dressing wounds Wound Cleanser Discharge Instruction: Wash your hands with soap and water. Remove old dressing, discard into plastic bag and place into trash. Cleanse the wound with Wound Cleanser prior to applying a clean dressing using gauze sponges, not tissues or cotton balls. Do not scrub or use excessive force. Pat dry using gauze sponges, not tissue or cotton balls. Peri-Wound Care AandD Ointment Discharge Instruction: Apply AandD Ointment as directed Triamcinolone Acetonide Cream, 0.1%, 15 (g) tube Discharge Instruction: Apply as directed. Topical Primary Dressing Hydrofera Blue Ready Transfer Foam, 2.5x2.5 (in/in) Discharge Instruction: Apply Hydrofera Blue Ready to wound bed as directed Secondary Dressing Gauze Discharge Instruction: As directed: dry, moistened with saline or moistened with Dakins Solution Secured With Compression Wrap Urgo K2 Lite, two layer compression system, regular Compression Stockings Add-Ons Electronic Signature(s) Signed: 12/24/2022 11:41:18 AM By: Yevonne Pax RN Entered By: Yevonne Pax on 12/22/2022 10:57:14 -------------------------------------------------------------------------------- Vitals Details Patient Name: Date of Service: Shawn Shawn Wagner, Shawn Wagner MES Wagner. 12/22/2022 1:30 PM Medical Record Number: [id number] Patient Account Number: [id number] Date of Birth/Sex: Treating RN: [birth year] (72 y.o. [name] Primary Care Meela Wareing: Cleon Dew Other Clinician: Referring Yarianna Varble: Treating Sanyah Molnar/Extender: RO BSO Dorris Carnes, MICHA EL G Self, Referral Weeks in Treatment: [address] [place], Shawn Wagner ([id number]) [id number] Page 11 of 11 Time Taken:  13:42 Temperature (F): 97.7 Height (in): 73 Pulse (bpm): 109 Source: Stated Respiratory Rate (breaths/min): 18 Weight (lbs): 187 Blood Pressure (mmHg): 136/78 Source: Stated Reference Range: 80 - 120 mg / dl Body Mass Index (BMI): 24.7 Electronic Signature(s) Signed: 12/24/2022 11:41:18 AM By: Yevonne Pax RN Entered By: Yevonne Pax on 12/22/2022 10:42:52  Schedule Follow-up Appointment: Yes Clinical Summary of Care: Electronic Signature(s) Shawn Wagner, Shawn Wagner ([id number]) [id number] Page 5 of 11 Signed: 12/22/2022 2:42:11 PM By: Yevonne Pax RN Entered By: Yevonne Pax on 12/22/2022 11:42:11 -------------------------------------------------------------------------------- Lower Extremity Assessment Details Patient Name: Date of Service: Shawn Wagner. 12/22/2022 1:30 PM Medical Record Number: [id number] Patient Account Number: [id number] Date of Birth/Sex: Treating RN: 19-May-1950 (72 y.o. [name]) Yevonne Pax Primary Care Lacharles Altschuler: Cleon Dew Other Clinician: Referring Zeven Kocak: Treating Lorena Benham/Extender: RO BSO N, MICHA EL G Self, Referral Weeks in Treatment: 0 Edema Assessment Assessed: [Left: No] [Right: No] Edema: [Left: Ye] [Right: s] Calf Left: Right: Point of Measurement: 36 cm From Medial Instep 36 cm Ankle Left: Right: Point of Measurement: 10 cm From Medial Instep 21.5 cm Knee To Floor Left: Right: From Medial Instep 46 cm Vascular Assessment Pulses: Dorsalis Pedis Palpable: [Right:Yes] Extremity colors, hair growth, and conditions: Extremity Color: [Right:Hyperpigmented] Hair Growth on Extremity:  [Right:No] Temperature of Extremity: [Right:Warm] Capillary Refill: [Right:< 3 seconds] Dependent Rubor: [Right:No] Blanched when Elevated: [Right:No Yes] Toe Nail Assessment Left: Right: Thick: Yes Discolored: Yes Deformed: Yes Improper Length and Hygiene: Yes Electronic Signature(s) Signed: 12/24/2022 11:41:18 AM By: Yevonne Pax RN Entered By: Yevonne Pax on 12/22/2022 10:58:07 Shawn Wagner ([id number]) [id number] Page 6 of 11 -------------------------------------------------------------------------------- Multi Wound Chart Details Patient Name: Date of Service: Shawn Wagner. 12/22/2022 1:30 PM Medical Record Number: [id number] Patient Account Number: [id number] Date of Birth/Sex: Treating RN: 24-Oct-1950 (72 y.o. [name]) Yevonne Pax Primary Care Srinivas Lippman: Cleon Dew Other Clinician: Referring Quenisha Lovins: Treating Dalisha Shively/Extender: RO BSO N, MICHA EL G Self, Referral Weeks in Treatment: 0 Vital Signs Height(in): 73 Pulse(bpm): 109 Weight(lbs): 187 Blood Pressure(mmHg): 136/78 Body Mass Index(BMI): 24.7 Temperature(F): 97.7 Respiratory Rate(breaths/min): 18 [2:Photos:] [N/A:N/A] Right, Lateral Lower Leg N/A N/A Wound Location: Trauma N/A N/A Wounding Event: Trauma, Other N/A N/A Primary Etiology: Coronary Artery Disease, N/A N/A Comorbid History: Hypertension, Type II Diabetes 12/20/2022 N/A N/A Date Acquired: 0 N/A N/A Weeks of Treatment: Open N/A N/A Wound Status: No N/A N/A Wound Recurrence: 3.5x2.5x0.2 N/A N/A Measurements L x W x Wagner (cm) 6.872 N/A N/A A (cm) : rea 1.374 N/A N/A Volume (cm) : Full Thickness Without Exposed N/A N/A Classification: Support Structures Medium N/A N/A Exudate Amount: Serosanguineous N/A N/A Exudate Type: red, brown N/A N/A Exudate Color: Large (67-100%) N/A N/A Granulation Amount: Red N/A N/A Granulation Quality: None Present (0%) N/A N/A Necrotic Amount: Fat Layer  (Subcutaneous Tissue): Yes N/A N/A Exposed Structures: Fascia: No Tendon: No Muscle: No Joint: No Bone: No None N/A N/A Epithelialization: Treatment Notes Electronic Signature(s) Signed: 12/22/2022 4:42:07 PM By: Baltazar Najjar MD Entered By: Baltazar Najjar on 12/22/2022 11:17:36 Shawn Wagner ([id number]) [id number] Page 7 of 11 -------------------------------------------------------------------------------- Multi-Disciplinary Care Plan Details Patient Name: Date of Service: Shawn Wagner. 12/22/2022 1:30 PM Medical Record Number: [id number] Patient Account Number: [id number] Date of Birth/Sex: Treating RN: July 01, 1950 (72 y.o. [name]) Yevonne Pax Primary Care Loan Oguin: Cleon Dew Other Clinician: Referring Zaron Zwiefelhofer: Treating Brandun Pinn/Extender: RO BSO N, MICHA EL G Self, Referral Weeks in Treatment: 0 Active Inactive Necrotic Tissue Nursing Diagnoses: Knowledge deficit related to management of necrotic/devitalized tissue Goals: Patient/caregiver will verbalize understanding of reason and process for debridement of necrotic tissue Date Initiated: 12/22/2022 Target Resolution Date: 01/22/2023 Goal Status: Active Interventions: Assess patient pain level pre-, during and post procedure and prior to discharge Notes: Wound/Skin Impairment Nursing Diagnoses: Knowledge deficit related to ulceration/compromised skin  Shawn Wagner, Shawn Wagner ([id number]) [id number] Page 1 of 11 Visit Report for 12/22/2022 Allergy List Details Patient Name: Date of Service: Shawn Wagner. 12/22/2022 1:30 PM Medical Record Number: [id number] Patient Account Number: [id number] Date of Birth/Sex: Treating RN: 17-Nov-1950 (72 y.o. [name]) Yevonne Pax Primary Care Gayla Benn: Cleon Dew Other Clinician: Referring Marky Buresh: Treating Coren Sagan/Extender: RO BSO N, MICHA EL G Self, Referral Weeks in Treatment: 0 Allergies Active Allergies hydrocodone Vicodin heparin Allergy Notes Electronic Signature(s) Signed: 12/24/2022 11:41:18 AM By: Yevonne Pax RN Entered By: Yevonne Pax on 12/22/2022 10:43:22 -------------------------------------------------------------------------------- Arrival Information Details Patient Name: Date of Service: Shawn Shawn Wagner, Shawn Wagner MES Wagner. 12/22/2022 1:30 PM Medical Record Number: [id number] Patient Account Number: [id number] Date of Birth/Sex: Treating RN: Dec 22, 1950 (72 y.o. [name]) Yevonne Pax Primary Care Jaianna Nicoll: Cleon Dew Other Clinician: Referring Cheyane Ayon: Treating Haniyyah Sakuma/Extender: RO BSO N, MICHA EL G Self, Referral Weeks in Treatment: 0 Visit Information Patient Arrived: Ambulatory Arrival Time: 13:41 Accompanied By: partner Transfer Assistance: None Patient Identification Verified: Yes Secondary Verification Process Completed: Yes Patient Requires Transmission-Based Precautions: No Patient Has Alerts: Yes Patient Alerts: ABI R 1.23 06/23/22 Shawn Wagner, Shawn Wagner ([id number]) History Since Last Visit Added or deleted any medications: No Any new allergies or adverse reactions: No Had a fall or experienced change in activities of daily living that may affect risk of falls: No Signs or symptoms of abuse/neglect since last visito No Hospitalized since last visit: No Implantable device outside of the clinic excluding cellular tissue based products placed in the  center since last visit: No Pain Present Now: No [id number] Page 2 of 11 Electronic Signature(s) Signed: 12/24/2022 11:41:18 AM By: Yevonne Pax RN Entered By: Yevonne Pax on 12/22/2022 10:59:01 -------------------------------------------------------------------------------- Clinic Level of Care Assessment Details Patient Name: Date of Service: Shawn Wagner. 12/22/2022 1:30 PM Medical Record Number: [id number] Patient Account Number: [id number] Date of Birth/Sex: Treating RN: 10/04/1950 (72 y.o. [name]) Yevonne Pax Primary Care Satoria Dunlop: Cleon Dew Other Clinician: Referring Lavance Beazer: Treating Nyra Anspaugh/Extender: RO BSO N, MICHA EL G Self, Referral Weeks in Treatment: 0 Clinic Level of Care Assessment Items TOOL 2 Quantity Score X- 1 0 Use when only an EandM is performed on the INITIAL visit ASSESSMENTS - Nursing Assessment / Reassessment X- 1 20 General Physical Exam (combine w/ comprehensive assessment (listed just below) when performed on new pt. evals) X- 1 25 Comprehensive Assessment (HX, ROS, Risk Assessments, Wounds Hx, etc.) ASSESSMENTS - Wound and Skin A ssessment / Reassessment X - Simple Wound Assessment / Reassessment - one wound 1 5 []  - 0 Complex Wound Assessment / Reassessment - multiple wounds []  - 0 Dermatologic / Skin Assessment (not related to wound area) ASSESSMENTS - Ostomy and/or Continence Assessment and Care []  - 0 Incontinence Assessment and Management []  - 0 Ostomy Care Assessment and Management (repouching, etc.) PROCESS - Coordination of Care []  - 0 Simple Patient / Family Education for ongoing care []  - 0 Complex (extensive) Patient / Family Education for ongoing care []  - 0 Staff obtains Chiropractor, Records, T Results / Process Orders est []  - 0 Staff telephones HHA, Nursing Homes / Clarify orders / etc []  - 0 Routine Transfer to another Facility (non-emergent condition) []  - 0 Routine Hospital Admission  (non-emergent condition) X- 1 15 New Admissions / Manufacturing engineer / Ordering NPWT Apligraf, etc. , []  - 0 Emergency Hospital Admission (emergent condition) X- 1 10 Simple Discharge Coordination []  - 0 Complex (extensive) Discharge Coordination PROCESS - Special Needs []  -

## 2022-12-24 NOTE — Progress Notes (Signed)
months 0 No Secondary diagnosis (Do you have 2 or more medical diagnoseso) 0 No Ambulatory aid None/bed rest/wheelchair/nurse 0 Yes Crutches/cane/walker 0 No Furniture 0 No Intravenous therapy Access/Saline/Heparin Lock 0 No Gait/Transferring Normal/ bed rest/ wheelchair 0 Yes Weak (short steps with or without shuffle, stooped but able to lift head while walking, may seek 0 No support from furniture) Impaired (short steps with shuffle, may have difficulty arising from chair, head down, impaired 0 No balance) Mental Status Oriented to own ability 0 Yes Electronic Signature(s) Signed: 12/24/2022 11:41:18 AM By: Yevonne Pax RN Entered By: Yevonne Pax on 12/22/2022 10:44:38 -------------------------------------------------------------------------------- Foot Assessment Details Patient Name: Date of Service: Shawn Wagner, Shawn Wagner MES D. 12/22/2022 1:30 PM Medical Record Number: [id number] Patient Account Number: [id number] Date of Birth/Sex: Treating RN: 03/23/1950 (72 y.o. [name] Primary Care Nathalya Wolanski: Cleon Dew Other Clinician: Referring Gunnison Chahal: Treating Dlynn Ranes/Extender: RO BSO N, MICHA EL G Self, Referral Weeks in Treatment: 0 Foot Assessment Items Site Locations MARCQUES, WEGHORST D ([id number]) [id number] Page 4 of 5 + = Sensation present, - = Sensation absent, C =  Callus, U = Ulcer R = Redness, W = Warmth, M = Maceration, PU = Pre-ulcerative lesion F = Fissure, S = Swelling, D = Dryness Assessment Right: Left: Other Deformity: No No Prior Foot Ulcer: No No Prior Amputation: No No Charcot Joint: No No Ambulatory Status: Ambulatory Without Help Gait: Steady Electronic Signature(s) Signed: 12/24/2022 11:41:18 AM By: Yevonne Pax RN Entered By: Yevonne Pax on 12/22/2022 10:45:54 -------------------------------------------------------------------------------- Nutrition Risk Screening Details Patient Name: Date of Service: Shawn Wagner MES D. 12/22/2022 1:30 PM Medical Record Number: [id number] Patient Account Number: [id number] Date of Birth/Sex: Treating RN: May 12, 1950 (72 y.o. Shawn Wagner) Yevonne Pax Primary Care Wylder Macomber: Cleon Dew Other Clinician: Referring Lowry Bala: Treating Hurbert Duran/Extender: RO BSO N, MICHA EL G Self, Referral Weeks in Treatment: 0 Height (in): 73 Weight (lbs): 187 Body Mass Index (BMI): 24.7 Nutrition Risk Screening Items Score Screening NUTRITION RISK SCREEN: I have an illness or condition that made me change the kind and/or amount of food I eat 0 No I eat fewer than two meals per day 0 No I eat few fruits and vegetables, or milk products 0 No I have three or more drinks of beer, liquor or wine almost every day 0 No I have tooth or mouth problems that make it hard for me to eat 0 No I don't always have enough money to buy the food I need 0 No DELL, SANTELLAN D ([id number]) [id number] Page 5 of 5 I eat alone most of the time 0 No I take three or more different prescribed or over-the-counter drugs a day 1 Yes Without wanting to, I have lost or gained 10 pounds in the last six months 0 No I am not always physically able to shop, cook and/or feed myself 0 No Nutrition Protocols Good Risk Protocol 0 No interventions needed Moderate Risk Protocol High Risk Proctocol Risk Level:  Good Risk Score: 1 Electronic Signature(s) Signed: 12/24/2022 11:41:18 AM By: Yevonne Pax RN Entered By: Yevonne Pax on 12/22/2022 10:44:54  ANDRY, BARFUSS D ([id number]) [id number] Page 1 of 5 Visit Report for 12/22/2022 Abuse Risk Screen Details Patient Name: Date of Service: Shawn Wagner MES D. 12/22/2022 1:30 PM Medical Record Number: [id number] Patient Account Number: [id number] Date of Birth/Sex: Treating RN: 1950/09/16 (72 y.o. Shawn Wagner) Yevonne Pax Primary Care Reyanna Baley: Cleon Dew Other Clinician: Referring Alexandra Posadas: Treating Kaedin Hicklin/Extender: RO BSO N, MICHA EL G Self, Referral Weeks in Treatment: 0 Abuse Risk Screen Items Answer ABUSE RISK SCREEN: Has anyone close to you tried to hurt or harm you recentlyo No Do you feel uncomfortable with anyone in your familyo No Has anyone forced you do things that you didnt want to doo No Electronic Signature(s) Signed: 12/24/2022 11:41:18 AM By: Yevonne Pax RN Entered By: Yevonne Pax on 12/22/2022 10:43:42 -------------------------------------------------------------------------------- Activities of Daily Living Details Patient Name: Date of Service: Shawn Wagner MES D. 12/22/2022 1:30 PM Medical Record Number: [id number] Patient Account Number: [id number] Date of Birth/Sex: Treating RN: [birth year] (72 y.o. Shawn Wagner) Yevonne Pax Primary Care Hannah Crill: Cleon Dew Other Clinician: Referring Nishi Neiswonger: Treating Daina Cara/Extender: RO BSO N, MICHA EL G Self, Referral Weeks in Treatment: 0 Activities of Daily Living Items Answer Activities of Daily Living (Please select one for each item) Drive Automobile Completely Able T Medications ake Completely Able Use T elephone Completely Able Care for Appearance Completely Able Use T oilet Completely Able Bath / Shower Completely Able Dress Self Completely Able Feed Self Completely Able Walk Completely Able Get In / Out Bed Completely Able Housework Completely GIOVONNI, ZAUGG ([id number]) [phone] [id number] Page 2 of 5 Prepare Meals Completely  Able Handle Money Completely Able Shop for Self Completely Able Electronic Signature(s) Signed: 12/24/2022 11:41:18 AM By: Yevonne Pax RN Entered By: Yevonne Pax on 12/22/2022 10:44:05 -------------------------------------------------------------------------------- Education Screening Details Patient Name: Date of Service: Shawn Wagner, Shawn Wagner MES D. 12/22/2022 1:30 PM Medical Record Number: [id number] Patient Account Number: [id number] Date of Birth/Sex: Treating RN: 1950/12/04 (72 y.o. Shawn Wagner) Yevonne Pax Primary Care Ardyce Heyer: Cleon Dew Other Clinician: Referring Lesleyann Fichter: Treating Kourtney Montesinos/Extender: RO BSO N, MICHA EL G Self, Referral Weeks in Treatment: 0 Primary Learner Assessed: Patient Learning Preferences/Education Level/Primary Language Learning Preference: Explanation Highest Education Level: High School Preferred Language: English Cognitive Barrier Language Barrier: No Translator Needed: No Memory Deficit: No Emotional Barrier: No Cultural/Religious Beliefs Affecting Medical Care: No Physical Barrier Impaired Vision: No Impaired Hearing: No Decreased Hand dexterity: No Knowledge/Comprehension Knowledge Level: Medium Comprehension Level: Medium Ability to understand written instructions: Medium Ability to understand verbal instructions: Medium Motivation Anxiety Level: Anxious Cooperation: Cooperative Education Importance: Acknowledges Need Interest in Health Problems: Asks Questions Perception: Coherent Willingness to Engage in Self-Management High Activities: Readiness to Engage in Self-Management High Activities: Electronic Signature(s) Signed: 12/24/2022 11:41:18 AM By: Yevonne Pax RN Entered By: Yevonne Pax on 12/22/2022 10:44:27 [name] ([id number]) [phone] [id number] Page 3 of 5 -------------------------------------------------------------------------------- Fall Risk Assessment Details Patient Name: Date of  Service: Shawn Wagner MES D. 12/22/2022 1:30 PM Medical Record Number: [id number] Patient Account Number: [id number] Date of Birth/Sex: Treating RN: 1950/09/28 (72 y.o. Shawn Wagner) Yevonne Pax Primary Care Ninfa Giannelli: Cleon Dew Other Clinician: Referring Kali Deadwyler: Treating Armani Brar/Extender: RO BSO N, MICHA EL G Self, Referral Weeks in Treatment: 0 Fall Risk Assessment Items Have you had 2 or more falls in the last 12 monthso 0 No Have you had any fall that resulted in injury in the last 12 monthso 0 No FALLS RISK SCREEN History of falling - immediate or within 3

## 2022-12-29 ENCOUNTER — Encounter: Payer: Medicare HMO | Admitting: Internal Medicine

## 2022-12-29 DIAGNOSIS — Z09 Encounter for follow-up examination after completed treatment for conditions other than malignant neoplasm: Secondary | ICD-10-CM | POA: Diagnosis not present

## 2023-01-03 NOTE — Progress Notes (Signed)
Shawn Wagner Wagner ([id number]) [id number] Page 1 of 5 Visit Report for 12/29/2022 HPI Details Patient Name: Date of Service: Shawn Wagner. 12/29/2022 3:00 PM Medical Record Number: [id number] Patient Account Number: [id number] Date of Birth/Sex: Treating RN: 1950-11-22 (72 y.o. [name]) Yevonne Pax Primary Care Provider: Cleon Dew Other Clinician: Referring Provider: Treating Provider/Extender: RO BSO Dorris Carnes, MICHA EL Franchot Mimes in Treatment: 1 History of Present Illness HPI Description: 06/23/2022 Shawn Wagner is a 72 year old male with a past medical history of chronic pain syndrome, CAD, CVA, Chronic venous insufficiency and type 2 diabetes That presents to the clinic for a several year history of wounds to his right lower extremity. He states that this started out as a MRSA infection many years ago and the wounds will heal however he will pick at the scabs and reopen the areas. He currently denies signs of infection. He keeps the areas covered. He has compression stockings but does not wear them. 4/24; patient presents for follow-up. We have been using Hydrofera Blue with antibiotic ointment under compression therapy. His wounds have healed. READMISSION 12/22/2022 This patient returns to the clinic today with an area predominantly on his right lower leg however he also has eschared areas bilaterally. He has not been moisturizing his skin and he has not been wearing his compression stockings apparently wore them for about 2 days when he left the clinic last time in April. He apparently pulled some skin from the lateral part of his right leg that is the only wound that looks concerning here. Right next to this area is a large scar which he says was from a MRSA infection years ago. He is a diabetic with very significant peripheral neuropathy although he states he does not have pain or pruritus in his legs. His past medical history is  reviewed. He is a type II diabetic with peripheral neuropathy chronic venous insufficiency, hypertension, mitral valve regurgitation, stage III chronic renal failure and a history of coronary artery disease 10/23; everything is closed on the left leg. He has a 20/30 stocking. He will uses moisturizer on his skin in his stocking and we went over this. Hopefully the moisturizer with some steroid will help with the pruritus and the urged to pick at the wounds. He still has an area on the right lateral calf although this is smaller and looks clean we used Hydrofera Blue under [employer]) Signed: 12/30/2022 3:33:32 PM By: Baltazar Najjar MD Entered By: Baltazar Najjar on 12/29/2022 12:48:58 -------------------------------------------------------------------------------- Physical Exam Details Patient Name: Date of Service: Shawn Wagner. 12/29/2022 3:00 PM Medical Record Number: [id number] Patient Account Number: [id number] Date of Birth/Sex: Treating RN: 1950-12-08 (72 y.o. [name] Primary Care Provider: Cleon Dew Other Clinician: Referring Provider: Treating Provider/Extender: Chauncey Mann, MICHA EL Franchot Mimes in Treatment: 1 WILLIA, SEMAN ([id number]) [id number] Page 2 of 5 Constitutional Sitting or standing Blood Pressure is within target range for patient.. Pulse regular and within target range for patient.Marland Kitchen Respirations regular, non-labored and within target range.. Temperature is normal and within the target range for the patient.Marland Kitchen appears in no distress. Notes Wound exam; everything is closed on the left leg and we can transition him into a stocking. On the right the right lateral leg wound is superficial and smaller and epithelializing. Electronic Signature(s) Signed: 12/30/2022 3:33:32 PM By: Baltazar Najjar MD Entered By: Baltazar Najjar on 12/29/2022  12:49:41 -------------------------------------------------------------------------------- Physician Orders Details Patient  Name: Date of Service: Shawn Wagner. 12/29/2022 3:00 PM Medical Record Number: [id number] Patient Account Number: [id number] Date of Birth/Sex: Treating RN: January 07, 1951 (72 y.o. [name]) Yevonne Pax Primary Care Provider: Cleon Dew Other Clinician: Referring Provider: Treating Provider/Extender: RO BSO N, MICHA EL Franchot Mimes in Treatment: 1 Verbal / Phone Orders: No Diagnosis Coding ICD-10 Coding Code Description [phone] Chronic venous hypertension (idiopathic) with ulcer and inflammation of bilateral lower extremity L97.811 Non-pressure chronic ulcer of other part of right lower leg limited to breakdown of skin L97.821 Non-pressure chronic ulcer of other part of left lower leg limited to breakdown of skin Follow-up Appointments Return Appointment in 1 week. Edema Control - Lymphedema / Segmental Compressive Device / Other Elevate, Exercise Daily and A void Standing for Long Periods of Time. Elevate legs to the level of the heart and pump ankles as often as possible Elevate leg(s) parallel to the floor when sitting. Wound Treatment Wound #2 - Lower Leg Wound Laterality: Right, Lateral Cleanser: Soap and Water 1 x Per Week/30 Days Discharge Instructions: Gently cleanse wound with antibacterial soap, rinse and pat dry prior to dressing wounds Cleanser: Wound Cleanser 1 x Per Week/30 Days Discharge Instructions: Wash your hands with soap and water. Remove old dressing, discard into plastic bag and place into trash. Cleanse the wound with Wound Cleanser prior to applying a clean dressing using gauze sponges, not tissues or cotton balls. Do not scrub or use excessive force. Pat dry using gauze sponges, not tissue or cotton balls. Peri-Wound Care: AandD Ointment 1 x Per Week/30 Days Discharge Instructions: Apply AandD Ointment as  directed Peri-Wound Care: Triamcinolone Acetonide Cream, 0.1%, 15 (g) tube 1 x Per Week/30 Days Discharge Instructions: Apply as directed. Prim Dressing: Hydrofera Blue Ready Transfer Foam, 2.5x2.5 (in/in) 1 x Per Week/30 Days ary Discharge Instructions: Apply Hydrofera Blue Ready to wound bed as directed Secondary Dressing: Gauze 1 x Per Week/30 Days Discharge Instructions: As directed: dry, moistened with saline or moistened with Dakins Solution Compression Wrap: Urgo K2 Lite, two layer compression system, regular 1 x Per Week/30 Days WAH, DILDINE ([id number]) [phone].pdf Page 3 of 5 Electronic Signature(s) Signed: 12/30/2022 3:33:32 PM By: Baltazar Najjar MD Signed: 01/03/2023 8:00:25 AM By: Yevonne Pax RN Previous Signature: 12/29/2022 3:54:54 PM Version By: Yevonne Pax RN Entered By: Yevonne Pax on 12/29/2022 12:55:36 -------------------------------------------------------------------------------- Problem List Details Patient Name: Date of Service: Shawn Wagner, Shawn Wagner. 12/29/2022 3:00 PM Medical Record Number: [id number] Patient Account Number: [id number] Date of Birth/Sex: Treating RN: [birth year] (72 y.o. [name] Primary Care Provider: Cleon Dew Other Clinician: Referring Provider: Treating Provider/Extender: Chauncey Mann, MICHA EL Franchot Mimes in Treatment: 1 Active Problems ICD-10 Encounter Code Description Active Date MDM Diagnosis I87.333 Chronic venous hypertension (idiopathic) with ulcer and inflammation of 12/22/2022 No Yes bilateral lower extremity L97.811 Non-pressure chronic ulcer of other part of right lower leg limited to breakdown 12/22/2022 No Yes of skin L97.821 Non-pressure chronic ulcer of other part of left lower leg limited to breakdown 12/22/2022 No Yes of skin Inactive Problems Resolved Problems Electronic Signature(s) Signed: 12/30/2022 3:33:32 PM By: Baltazar Najjar MD Entered By: Baltazar Najjar on 12/29/2022 12:48:01 Shawn Wagner ([id number]) [id number] Page 4 of 5 -------------------------------------------------------------------------------- Progress Note Details Patient Name: Date of Service: Shawn Wagner. 12/29/2022 3:00 PM Medical Record Number: [id number] Patient Account Number: [id number] Date of Birth/Sex: Treating RN: February 22, 1951 (72 y.o. [name]) Yevonne Pax Primary Care  Provider: Cleon Dew Other Clinician: Referring Provider: Treating Provider/Extender: RO BSO N, MICHA EL Franchot Mimes in Treatment: 1 Subjective History of Present Illness (HPI) 06/23/2022 Shawn Wagner is a 72 year old male with a past medical history of chronic pain syndrome, CAD, CVA, Chronic venous insufficiency and type 2 diabetes That presents to the clinic for a several year history of wounds to his right lower extremity. He states that this started out as a MRSA infection many years ago and the wounds will heal however he will pick at the scabs and reopen the areas. He currently denies signs of infection. He keeps the areas covered. He has compression stockings but does not wear them. 4/24; patient presents for follow-up. We have been using Hydrofera Blue with antibiotic ointment under compression therapy. His wounds have healed. READMISSION 12/22/2022 This patient returns to the clinic today with an area predominantly on his right lower leg however he also has eschared areas bilaterally. He has not been moisturizing his skin and he has not been wearing his compression stockings apparently wore them for about 2 days when he left the clinic last time in April. He apparently pulled some skin from the lateral part of his right leg that is the only wound that looks concerning here. Right next to this area is a large scar which he says was from a MRSA infection years ago. He is a diabetic with very significant peripheral neuropathy  although he states he does not have pain or pruritus in his legs. His past medical history is reviewed. He is a type II diabetic with peripheral neuropathy chronic venous insufficiency, hypertension, mitral valve regurgitation, stage III chronic renal failure and a history of coronary artery disease 10/23; everything is closed on the left leg. He has a 20/30 stocking. He will uses moisturizer on his skin in his stocking and we went over this. Hopefully the moisturizer with some steroid will help with the pruritus and the urged to pick at the wounds. He still has an area on the right lateral calf although this is smaller and looks clean we used Hydrofera Blue under Urgo K2 lite Objective Constitutional Sitting or standing Blood Pressure is within target range for patient.. Pulse regular and within target range for patient.Marland Kitchen Respirations regular, non-labored and within target range.. Temperature is normal and within the target range for the patient.Marland Kitchen appears in no distress. Vitals Time Taken: 3:17 PM, Height: 73 in, Weight: 187 lbs, BMI: 24.7, Temperature: 98.1 F, Pulse: 78 bpm, Respiratory Rate: 18 breaths/min, Blood Pressure: 127/75 mmHg. General Notes: Wound exam; everything is closed on the left leg and we can transition him into a stocking. On the right the right lateral leg wound is superficial and smaller and epithelializing. Integumentary (Hair, Skin) Wound #2 status is Open. Original cause of wound was Trauma. The date acquired was: 12/20/2022. The wound has been in treatment 1 weeks. The wound is located on the Right,Lateral Lower Leg. The wound measures 2cm length x 1.7cm width x 0.2cm depth; 2.67cm^2 area and 0.534cm^3 volume. There is Fat Layer (Subcutaneous Tissue) exposed. There is no tunneling or undermining noted. There is a medium amount of serosanguineous drainage noted. There is large (67-100%) red granulation within the wound bed. There is no necrotic tissue within the wound  bed. Assessment Active Problems ICD-10 Chronic venous hypertension (idiopathic) with ulcer and inflammation of bilateral lower extremity Non-pressure chronic ulcer of other part of right lower leg limited to breakdown of skin Non-pressure chronic ulcer of  other part of left lower leg limited to breakdown of skin Plan Shawn Wagner, Shawn Wagner ([id number]) [id number] Page 5 of 5 1. The left leg can be transitioned into to his TCA with compression stockings 2. Still Hydrofera Blue is the primary dressing and Urgo K2 lite on the right leg. I am not expecting this to be a problem. No debridement was necessary. 3. The picking and scratching issue on his legs is something that has been apparently going on for a long time. I gave him a prescription last week for triamcinolone 0.1% 1-4 and Cetaphil cream hopefully this will break this cycle at least temporarily Electronic Signature(s) Signed: 12/30/2022 3:33:32 PM By: Baltazar Najjar MD Entered By: Baltazar Najjar on 12/29/2022 12:50:57 -------------------------------------------------------------------------------- SuperBill Details Patient Name: Date of Service: Shawn Wagner, Shawn Wagner. 12/29/2022 Medical Record Number: [id number] Patient Account Number: [id number] Date of Birth/Sex: Treating RN: 1950/12/02 (72 y.o. [name]) Yevonne Pax Primary Care Provider: Cleon Dew Other Clinician: Referring Provider: Treating Provider/Extender: RO BSO N, MICHA EL Franchot Mimes in Treatment: 1 Diagnosis Coding ICD-10 Codes Code Description [phone] Chronic venous hypertension (idiopathic) with ulcer and inflammation of bilateral lower extremity L97.811 Non-pressure chronic ulcer of other part of right lower leg limited to breakdown of skin L97.821 Non-pressure chronic ulcer of other part of left lower leg limited to breakdown of skin Facility Procedures : CPT4 Code: [id number] Description: (Facility Use Only) [phone] -  APPLY MULTLAY COMPRS LWR RT LEG Modifier: Quantity: 1 Physician Procedures : CPT4 Code Description Modifier [id number] - WC PHYS LEVEL 3 - EST PT ICD-10 Diagnosis Description I87.333 Chronic venous hypertension (idiopathic) with ulcer and inflammation of bilateral lower extremity L97.811 Non-pressure chronic ulcer of other  part of right lower leg limited to breakdown of skin L97.821 Non-pressure chronic ulcer of other part of left lower leg limited to breakdown of skin Quantity: 1 Electronic Signature(s) Signed: 12/29/2022 3:56:09 PM By: Yevonne Pax RN Signed: 12/30/2022 3:33:32 PM By: Baltazar Najjar MD Entered By: Yevonne Pax on 12/29/2022 12:56:09

## 2023-01-03 NOTE — Progress Notes (Signed)
Shawn Wagner, Shawn Wagner ([id number]) [id number] Page 1 of 9 Visit Report for 12/29/2022 Arrival Information Details Patient Name: Date of Service: Shawn Wagner. 12/29/2022 3:00 PM Medical Record Number: [id number] Patient Account Number: [id number] Date of Birth/Sex: Treating RN: 02/21/1951 (72 y.o. [name]) Yevonne Pax Primary Care Riddhi Grether: Cleon Dew Other Clinician: Referring Lynnsey Barbara: Treating Tyland Klemens/Extender: RO BSO Dorris Carnes, MICHA EL Franchot Mimes in Treatment: 1 Visit Information History Since Last Visit Added or deleted any medications: No Patient Arrived: Ambulatory Any new allergies or adverse reactions: No Arrival Time: 15:13 Had a fall or experienced change in No Accompanied By: self activities of daily living that may affect Transfer Assistance: None risk of falls: Patient Identification Verified: Yes Signs or symptoms of abuse/neglect since last visito No Secondary Verification Process Completed: Yes Hospitalized since last visit: No Patient Requires Transmission-Based Precautions: No Implantable device outside of the clinic excluding No Patient Has Alerts: Yes cellular tissue based products placed in the center Patient Alerts: ABI R 1.23 06/23/22 since last visit: Has Dressing in Place as Prescribed: Yes Has Compression in Place as Prescribed: Yes Pain Present Now: No Electronic Signature(s) Signed: 01/03/2023 8:00:25 AM By: Yevonne Pax RN Entered By: Yevonne Pax on 12/29/2022 12:17:30 -------------------------------------------------------------------------------- Clinic Level of Care Assessment Details Patient Name: Date of Service: Shawn Wagner. 12/29/2022 3:00 PM Medical Record Number: [id number] Patient Account Number: [id number] Date of Birth/Sex: Treating RN: 04/13/1950 (72 y.o. [name]) Yevonne Pax Primary Care Jaycion Treml: Cleon Dew Other Clinician: Referring Yao Hyppolite: Treating Selmer Adduci/Extender: RO BSO  N, MICHA EL Franchot Mimes in Treatment: 1 Clinic Level of Care Assessment Items TOOL 1 Quantity Score []  - 0 Use when EandM and Procedure is performed on INITIAL visit ASSESSMENTS - Nursing Assessment / Reassessment []  - 0 General Physical Exam (combine w/ comprehensive assessment (listed just below) when performed on new pt. evals) []  - 0 Comprehensive Assessment (HX, ROS, Risk Assessments, Wounds Hx, etc.) Shawn Wagner ([id number]) [id number] Page 2 of 9 ASSESSMENTS - Wound and Skin Assessment / Reassessment []  - 0 Dermatologic / Skin Assessment (not related to wound area) ASSESSMENTS - Ostomy and/or Continence Assessment and Care []  - 0 Incontinence Assessment and Management []  - 0 Ostomy Care Assessment and Management (repouching, etc.) PROCESS - Coordination of Care []  - 0 Simple Patient / Family Education for ongoing care []  - 0 Complex (extensive) Patient / Family Education for ongoing care []  - 0 Staff obtains Chiropractor, Records, T Results / Process Orders est []  - 0 Staff telephones HHA, Nursing Homes / Clarify orders / etc []  - 0 Routine Transfer to another Facility (non-emergent condition) []  - 0 Routine Hospital Admission (non-emergent condition) []  - 0 New Admissions / Manufacturing engineer / Ordering NPWT Apligraf, etc. , []  - 0 Emergency Hospital Admission (emergent condition) PROCESS - Special Needs []  - 0 Pediatric / Minor Patient Management []  - 0 Isolation Patient Management []  - 0 Hearing / Language / Visual special needs []  - 0 Assessment of Community assistance (transportation, Wagner/C planning, etc.) []  - 0 Additional assistance / Altered mentation []  - 0 Support Surface(s) Assessment (bed, cushion, seat, etc.) INTERVENTIONS - Miscellaneous []  - 0 External ear exam []  - 0 Patient Transfer (multiple staff / Nurse, adult / Similar devices) []  - 0 Simple Staple / Suture removal (25 or less) []  -  0 Complex Staple / Suture removal (26 or more) []  - 0 Hypo/Hyperglycemic Management (do not check if billed separately) []  -  Shawn Wagner, Shawn Wagner ([id number]) [id number] Page 1 of 9 Visit Report for 12/29/2022 Arrival Information Details Patient Name: Date of Service: Shawn Wagner. 12/29/2022 3:00 PM Medical Record Number: [id number] Patient Account Number: [id number] Date of Birth/Sex: Treating RN: 02/21/1951 (72 y.o. [name]) Yevonne Pax Primary Care Riddhi Grether: Cleon Dew Other Clinician: Referring Lynnsey Barbara: Treating Tyland Klemens/Extender: RO BSO Dorris Carnes, MICHA EL Franchot Mimes in Treatment: 1 Visit Information History Since Last Visit Added or deleted any medications: No Patient Arrived: Ambulatory Any new allergies or adverse reactions: No Arrival Time: 15:13 Had a fall or experienced change in No Accompanied By: self activities of daily living that may affect Transfer Assistance: None risk of falls: Patient Identification Verified: Yes Signs or symptoms of abuse/neglect since last visito No Secondary Verification Process Completed: Yes Hospitalized since last visit: No Patient Requires Transmission-Based Precautions: No Implantable device outside of the clinic excluding No Patient Has Alerts: Yes cellular tissue based products placed in the center Patient Alerts: ABI R 1.23 06/23/22 since last visit: Has Dressing in Place as Prescribed: Yes Has Compression in Place as Prescribed: Yes Pain Present Now: No Electronic Signature(s) Signed: 01/03/2023 8:00:25 AM By: Yevonne Pax RN Entered By: Yevonne Pax on 12/29/2022 12:17:30 -------------------------------------------------------------------------------- Clinic Level of Care Assessment Details Patient Name: Date of Service: Shawn Wagner. 12/29/2022 3:00 PM Medical Record Number: [id number] Patient Account Number: [id number] Date of Birth/Sex: Treating RN: 04/13/1950 (72 y.o. [name]) Yevonne Pax Primary Care Jaycion Treml: Cleon Dew Other Clinician: Referring Yao Hyppolite: Treating Selmer Adduci/Extender: RO BSO  N, MICHA EL Franchot Mimes in Treatment: 1 Clinic Level of Care Assessment Items TOOL 1 Quantity Score []  - 0 Use when EandM and Procedure is performed on INITIAL visit ASSESSMENTS - Nursing Assessment / Reassessment []  - 0 General Physical Exam (combine w/ comprehensive assessment (listed just below) when performed on new pt. evals) []  - 0 Comprehensive Assessment (HX, ROS, Risk Assessments, Wounds Hx, etc.) Shawn Wagner ([id number]) [id number] Page 2 of 9 ASSESSMENTS - Wound and Skin Assessment / Reassessment []  - 0 Dermatologic / Skin Assessment (not related to wound area) ASSESSMENTS - Ostomy and/or Continence Assessment and Care []  - 0 Incontinence Assessment and Management []  - 0 Ostomy Care Assessment and Management (repouching, etc.) PROCESS - Coordination of Care []  - 0 Simple Patient / Family Education for ongoing care []  - 0 Complex (extensive) Patient / Family Education for ongoing care []  - 0 Staff obtains Chiropractor, Records, T Results / Process Orders est []  - 0 Staff telephones HHA, Nursing Homes / Clarify orders / etc []  - 0 Routine Transfer to another Facility (non-emergent condition) []  - 0 Routine Hospital Admission (non-emergent condition) []  - 0 New Admissions / Manufacturing engineer / Ordering NPWT Apligraf, etc. , []  - 0 Emergency Hospital Admission (emergent condition) PROCESS - Special Needs []  - 0 Pediatric / Minor Patient Management []  - 0 Isolation Patient Management []  - 0 Hearing / Language / Visual special needs []  - 0 Assessment of Community assistance (transportation, Wagner/C planning, etc.) []  - 0 Additional assistance / Altered mentation []  - 0 Support Surface(s) Assessment (bed, cushion, seat, etc.) INTERVENTIONS - Miscellaneous []  - 0 External ear exam []  - 0 Patient Transfer (multiple staff / Nurse, adult / Similar devices) []  - 0 Simple Staple / Suture removal (25 or less) []  -  0 Complex Staple / Suture removal (26 or more) []  - 0 Hypo/Hyperglycemic Management (do not check if billed separately) []  -  By: Yevonne Pax RN Entered By: Yevonne Pax on 12/29/2022 12:25:30 Shawn Wagner ([id number]) [id number] Page 8 of 9 -------------------------------------------------------------------------------- Wound Assessment Details Patient Name: Date of Service: Shawn Roby Lofts MES  Wagner. 12/29/2022 3:00 PM Medical Record Number: [id number] Patient Account Number: [id number] Date of Birth/Sex: Treating RN: [birth year] (72 y.o. [name]) Yevonne Pax Primary Care Broly Hatfield: Cleon Dew Other Clinician: Referring Ellieanna Funderburg: Treating Codylee Patil/Extender: RO BSO N, MICHA EL Franchot Mimes in Treatment: 1 Wound Status Wound Number: 2 Primary Etiology: Trauma, Other Wound Location: Right, Lateral Lower Leg Wound Status: Open Wounding Event: Trauma Comorbid History: Coronary Artery Disease, Hypertension, Type II Diabetes Date Acquired: 12/20/2022 Weeks Of Treatment: 1 Clustered Wound: No Photos Wound Measurements Length: (cm) 2 Width: (cm) 1.7 Depth: (cm) 0.2 Area: (cm) 2.67 Volume: (cm) 0.534 % Reduction in Area: 61.1% % Reduction in Volume: 61.1% Epithelialization: None Tunneling: No Undermining: No Wound Description Classification: Full Thickness Without Exposed Support Structures Exudate Amount: Medium Exudate Type: Serosanguineous Exudate Color: red, brown Foul Odor After Cleansing: No Slough/Fibrino No Wound Bed Granulation Amount: Large (67-100%) Exposed Structure Granulation Quality: Red Fascia Exposed: No Necrotic Amount: None Present (0%) Fat Layer (Subcutaneous Tissue) Exposed: Yes Tendon Exposed: No Muscle Exposed: No Joint Exposed: No Bone Exposed: No Treatment Notes Wound #2 (Lower Leg) Wound Laterality: Right, Lateral Cleanser Soap and Water Discharge Instruction: Gently cleanse wound with antibacterial soap, rinse and pat dry prior to dressing wounds Wound Cleanser BOBIE, CICCARELLO Wagner ([id number]) [phone].pdf Page 9 of 9 Discharge Instruction: Wash your hands with soap and water. Remove old dressing, discard into plastic bag and place into trash. Cleanse the wound with Wound Cleanser prior to applying a clean dressing using gauze sponges, not tissues or cotton balls. Do not scrub or use excessive force.  Pat dry using gauze sponges, not tissue or cotton balls. Peri-Wound Care AandD Ointment Discharge Instruction: Apply AandD Ointment as directed Triamcinolone Acetonide Cream, 0.1%, 15 (g) tube Discharge Instruction: Apply as directed. Topical Primary Dressing Hydrofera Blue Ready Transfer Foam, 2.5x2.5 (in/in) Discharge Instruction: Apply Hydrofera Blue Ready to wound bed as directed Secondary Dressing Gauze Discharge Instruction: As directed: dry, moistened with saline or moistened with Dakins Solution Secured With Compression Wrap Urgo K2 Lite, two layer compression system, regular Compression Stockings Add-Ons Electronic Signature(s) Signed: 01/03/2023 8:00:25 AM By: Yevonne Pax RN Entered By: Yevonne Pax on 12/29/2022 12:24:04 -------------------------------------------------------------------------------- Vitals Details Patient Name: Date of Service: Shawn Wagner, Shawn MES Wagner. 12/29/2022 3:00 PM Medical Record Number: [id number] Patient Account Number: [id number] Date of Birth/Sex: Treating RN: [birth year] (72 y.o. [name]) Yevonne Pax Primary Care Shantay Sonn: Cleon Dew Other Clinician: Referring Chonte Ricke: Treating Adelynn Gipe/Extender: RO BSO N, MICHA EL Franchot Mimes in Treatment: 1 Vital Signs Time Taken: 15:17 Temperature (F): 98.1 Height (in): 73 Pulse (bpm): 78 Weight (lbs): 187 Respiratory Rate (breaths/min): 18 Body Mass Index (BMI): 24.7 Blood Pressure (mmHg): 127/75 Reference Range: 80 - 120 mg / dl Electronic Signature(s) Signed: 01/03/2023 8:00:25 AM By: Yevonne Pax RN Entered By: Yevonne Pax on 12/29/2022 12:17:52  By: Yevonne Pax RN Entered By: Yevonne Pax on 12/29/2022 12:25:30 Shawn Wagner ([id number]) [id number] Page 8 of 9 -------------------------------------------------------------------------------- Wound Assessment Details Patient Name: Date of Service: Shawn Roby Lofts MES  Wagner. 12/29/2022 3:00 PM Medical Record Number: [id number] Patient Account Number: [id number] Date of Birth/Sex: Treating RN: [birth year] (72 y.o. [name]) Yevonne Pax Primary Care Broly Hatfield: Cleon Dew Other Clinician: Referring Ellieanna Funderburg: Treating Codylee Patil/Extender: RO BSO N, MICHA EL Franchot Mimes in Treatment: 1 Wound Status Wound Number: 2 Primary Etiology: Trauma, Other Wound Location: Right, Lateral Lower Leg Wound Status: Open Wounding Event: Trauma Comorbid History: Coronary Artery Disease, Hypertension, Type II Diabetes Date Acquired: 12/20/2022 Weeks Of Treatment: 1 Clustered Wound: No Photos Wound Measurements Length: (cm) 2 Width: (cm) 1.7 Depth: (cm) 0.2 Area: (cm) 2.67 Volume: (cm) 0.534 % Reduction in Area: 61.1% % Reduction in Volume: 61.1% Epithelialization: None Tunneling: No Undermining: No Wound Description Classification: Full Thickness Without Exposed Support Structures Exudate Amount: Medium Exudate Type: Serosanguineous Exudate Color: red, brown Foul Odor After Cleansing: No Slough/Fibrino No Wound Bed Granulation Amount: Large (67-100%) Exposed Structure Granulation Quality: Red Fascia Exposed: No Necrotic Amount: None Present (0%) Fat Layer (Subcutaneous Tissue) Exposed: Yes Tendon Exposed: No Muscle Exposed: No Joint Exposed: No Bone Exposed: No Treatment Notes Wound #2 (Lower Leg) Wound Laterality: Right, Lateral Cleanser Soap and Water Discharge Instruction: Gently cleanse wound with antibacterial soap, rinse and pat dry prior to dressing wounds Wound Cleanser BOBIE, CICCARELLO Wagner ([id number]) [phone].pdf Page 9 of 9 Discharge Instruction: Wash your hands with soap and water. Remove old dressing, discard into plastic bag and place into trash. Cleanse the wound with Wound Cleanser prior to applying a clean dressing using gauze sponges, not tissues or cotton balls. Do not scrub or use excessive force.  Pat dry using gauze sponges, not tissue or cotton balls. Peri-Wound Care AandD Ointment Discharge Instruction: Apply AandD Ointment as directed Triamcinolone Acetonide Cream, 0.1%, 15 (g) tube Discharge Instruction: Apply as directed. Topical Primary Dressing Hydrofera Blue Ready Transfer Foam, 2.5x2.5 (in/in) Discharge Instruction: Apply Hydrofera Blue Ready to wound bed as directed Secondary Dressing Gauze Discharge Instruction: As directed: dry, moistened with saline or moistened with Dakins Solution Secured With Compression Wrap Urgo K2 Lite, two layer compression system, regular Compression Stockings Add-Ons Electronic Signature(s) Signed: 01/03/2023 8:00:25 AM By: Yevonne Pax RN Entered By: Yevonne Pax on 12/29/2022 12:24:04 -------------------------------------------------------------------------------- Vitals Details Patient Name: Date of Service: Shawn Wagner, Shawn MES Wagner. 12/29/2022 3:00 PM Medical Record Number: [id number] Patient Account Number: [id number] Date of Birth/Sex: Treating RN: [birth year] (72 y.o. [name]) Yevonne Pax Primary Care Shantay Sonn: Cleon Dew Other Clinician: Referring Chonte Ricke: Treating Adelynn Gipe/Extender: RO BSO N, MICHA EL Franchot Mimes in Treatment: 1 Vital Signs Time Taken: 15:17 Temperature (F): 98.1 Height (in): 73 Pulse (bpm): 78 Weight (lbs): 187 Respiratory Rate (breaths/min): 18 Body Mass Index (BMI): 24.7 Blood Pressure (mmHg): 127/75 Reference Range: 80 - 120 mg / dl Electronic Signature(s) Signed: 01/03/2023 8:00:25 AM By: Yevonne Pax RN Entered By: Yevonne Pax on 12/29/2022 12:17:52

## 2023-01-05 ENCOUNTER — Encounter: Payer: Medicare HMO | Admitting: Internal Medicine

## 2023-01-05 DIAGNOSIS — Z09 Encounter for follow-up examination after completed treatment for conditions other than malignant neoplasm: Secondary | ICD-10-CM | POA: Diagnosis not present

## 2023-01-06 NOTE — Progress Notes (Signed)
Shawn Wagner ([id number]) [id number] Page 1 of 8 Visit Report for 01/05/2023 Arrival Information Details Patient Name: Date of Service: Shawn Wagner MES Wagner. 01/05/2023 3:45 PM Medical Record Number: [id number] Patient Account Number: [id number] Date of Birth/Sex: Treating RN: [birth year] (72 y.o. [name]) Yevonne Pax Primary Care Cristol Engdahl: Cleon Dew Other Clinician: Referring Subrena Devereux: Treating Annalaura Sauseda/Extender: RO BSO Dorris Carnes, MICHA EL Franchot Mimes in Treatment: 2 Visit Information History Since Last Visit Added or deleted any medications: No Patient Arrived: Ambulatory Any new allergies or adverse reactions: No Arrival Time: 15:45 Had a fall or experienced change in No Accompanied By: self activities of daily living that may affect Transfer Assistance: None risk of falls: Patient Identification Verified: Yes Signs or symptoms of abuse/neglect since last visito No Secondary Verification Process Completed: Yes Hospitalized since last visit: No Patient Requires Transmission-Based Precautions: No Implantable device outside of the clinic excluding No Patient Has Alerts: Yes cellular tissue based products placed in the center Patient Alerts: ABI R 1.23 06/23/22 since last visit: Has Dressing in Place as Prescribed: Yes Pain Present Now: No Electronic Signature(s) Signed: 01/06/2023 3:34:18 PM By: Yevonne Pax RN Entered By: Yevonne Pax on 01/05/2023 12:46:00 -------------------------------------------------------------------------------- Clinic Level of Care Assessment Details Patient Name: Date of Service: Shawn Wagner MES Wagner. 01/05/2023 3:45 PM Medical Record Number: [id number] Patient Account Number: [id number] Date of Birth/Sex: Treating RN: [birth year] (72 y.o. [name]) Yevonne Pax Primary Care Monia Timmers: Cleon Dew Other Clinician: Referring Nashia Remus: Treating Okema Rollinson/Extender: RO BSO N, MICHA EL Franchot Mimes in  Treatment: 2 Clinic Level of Care Assessment Items TOOL 4 Quantity Score X- 1 0 Use when only an EandM is performed on FOLLOW-UP visit ASSESSMENTS - Nursing Assessment / Reassessment X- 1 10 Reassessment of Co-morbidities (includes updates in patient status) X- 1 5 Reassessment of Adherence to Treatment Plan Shawn Wagner, Shawn Wagner ([id number]) [phone].pdf Page 2 of 8 ASSESSMENTS - Wound and Skin A ssessment / Reassessment X - Simple Wound Assessment / Reassessment - one wound 1 5 []  - 0 Complex Wound Assessment / Reassessment - multiple wounds []  - 0 Dermatologic / Skin Assessment (not related to wound area) ASSESSMENTS - Focused Assessment []  - 0 Circumferential Edema Measurements - multi extremities []  - 0 Nutritional Assessment / Counseling / Intervention []  - 0 Lower Extremity Assessment (monofilament, tuning fork, pulses) []  - 0 Peripheral Arterial Disease Assessment (using hand held doppler) ASSESSMENTS - Ostomy and/or Continence Assessment and Care []  - 0 Incontinence Assessment and Management []  - 0 Ostomy Care Assessment and Management (repouching, etc.) PROCESS - Coordination of Care []  - 0 Simple Patient / Family Education for ongoing care []  - 0 Complex (extensive) Patient / Family Education for ongoing care []  - 0 Staff obtains Chiropractor, Records, T Results / Process Orders est []  - 0 Staff telephones HHA, Nursing Homes / Clarify orders / etc []  - 0 Routine Transfer to another Facility (non-emergent condition) []  - 0 Routine Hospital Admission (non-emergent condition) []  - 0 New Admissions / Manufacturing engineer / Ordering NPWT Apligraf, etc. , []  - 0 Emergency Hospital Admission (emergent condition) X- 1 10 Simple Discharge Coordination []  - 0 Complex (extensive) Discharge Coordination PROCESS - Special Needs []  - 0 Pediatric / Minor Patient Management []  - 0 Isolation Patient Management []  - 0 Hearing / Language / Visual  special needs []  - 0 Assessment of Community assistance (transportation, Wagner/C planning, etc.) []  - 0 Additional assistance / Altered mentation []  - 0 Support Surface(s)  Shawn Wagner ([id number]) [id number] Page 1 of 8 Visit Report for 01/05/2023 Arrival Information Details Patient Name: Date of Service: Shawn Wagner MES Wagner. 01/05/2023 3:45 PM Medical Record Number: [id number] Patient Account Number: [id number] Date of Birth/Sex: Treating RN: [birth year] (72 y.o. [name]) Yevonne Pax Primary Care Cristol Engdahl: Cleon Dew Other Clinician: Referring Subrena Devereux: Treating Annalaura Sauseda/Extender: RO BSO Dorris Carnes, MICHA EL Franchot Mimes in Treatment: 2 Visit Information History Since Last Visit Added or deleted any medications: No Patient Arrived: Ambulatory Any new allergies or adverse reactions: No Arrival Time: 15:45 Had a fall or experienced change in No Accompanied By: self activities of daily living that may affect Transfer Assistance: None risk of falls: Patient Identification Verified: Yes Signs or symptoms of abuse/neglect since last visito No Secondary Verification Process Completed: Yes Hospitalized since last visit: No Patient Requires Transmission-Based Precautions: No Implantable device outside of the clinic excluding No Patient Has Alerts: Yes cellular tissue based products placed in the center Patient Alerts: ABI R 1.23 06/23/22 since last visit: Has Dressing in Place as Prescribed: Yes Pain Present Now: No Electronic Signature(s) Signed: 01/06/2023 3:34:18 PM By: Yevonne Pax RN Entered By: Yevonne Pax on 01/05/2023 12:46:00 -------------------------------------------------------------------------------- Clinic Level of Care Assessment Details Patient Name: Date of Service: Shawn Wagner MES Wagner. 01/05/2023 3:45 PM Medical Record Number: [id number] Patient Account Number: [id number] Date of Birth/Sex: Treating RN: [birth year] (72 y.o. [name]) Yevonne Pax Primary Care Monia Timmers: Cleon Dew Other Clinician: Referring Nashia Remus: Treating Okema Rollinson/Extender: RO BSO N, MICHA EL Franchot Mimes in  Treatment: 2 Clinic Level of Care Assessment Items TOOL 4 Quantity Score X- 1 0 Use when only an EandM is performed on FOLLOW-UP visit ASSESSMENTS - Nursing Assessment / Reassessment X- 1 10 Reassessment of Co-morbidities (includes updates in patient status) X- 1 5 Reassessment of Adherence to Treatment Plan Shawn Wagner, Shawn Wagner ([id number]) [phone].pdf Page 2 of 8 ASSESSMENTS - Wound and Skin A ssessment / Reassessment X - Simple Wound Assessment / Reassessment - one wound 1 5 []  - 0 Complex Wound Assessment / Reassessment - multiple wounds []  - 0 Dermatologic / Skin Assessment (not related to wound area) ASSESSMENTS - Focused Assessment []  - 0 Circumferential Edema Measurements - multi extremities []  - 0 Nutritional Assessment / Counseling / Intervention []  - 0 Lower Extremity Assessment (monofilament, tuning fork, pulses) []  - 0 Peripheral Arterial Disease Assessment (using hand held doppler) ASSESSMENTS - Ostomy and/or Continence Assessment and Care []  - 0 Incontinence Assessment and Management []  - 0 Ostomy Care Assessment and Management (repouching, etc.) PROCESS - Coordination of Care []  - 0 Simple Patient / Family Education for ongoing care []  - 0 Complex (extensive) Patient / Family Education for ongoing care []  - 0 Staff obtains Chiropractor, Records, T Results / Process Orders est []  - 0 Staff telephones HHA, Nursing Homes / Clarify orders / etc []  - 0 Routine Transfer to another Facility (non-emergent condition) []  - 0 Routine Hospital Admission (non-emergent condition) []  - 0 New Admissions / Manufacturing engineer / Ordering NPWT Apligraf, etc. , []  - 0 Emergency Hospital Admission (emergent condition) X- 1 10 Simple Discharge Coordination []  - 0 Complex (extensive) Discharge Coordination PROCESS - Special Needs []  - 0 Pediatric / Minor Patient Management []  - 0 Isolation Patient Management []  - 0 Hearing / Language / Visual  special needs []  - 0 Assessment of Community assistance (transportation, Wagner/C planning, etc.) []  - 0 Additional assistance / Altered mentation []  - 0 Support Surface(s)  in Volume: Full Thickness Without Exposed Classification: Support Structures Medium Exudate Amount: Serosanguineous Exudate Type: red, brown Exudate Color:] [N/A:N/A N/A N/A N/A  N/A N/A N/A N/A N/A N/A N/A N/A N/A N/A N/A N/A N/A] Treatment Notes Electronic Signature(s) Signed: 01/05/2023 4:12:48 PM By: Yevonne Pax RN Entered By: Yevonne Pax on 01/05/2023 13:12:48 Shawn Wagner ([id number]) [id number] Page 5 of 8 -------------------------------------------------------------------------------- Multi-Disciplinary Care Plan Details Patient Name: Date of Service: Shawn Wagner MES Wagner. 01/05/2023 3:45 PM Medical Record Number: [id number] Patient Account Number: [id number] Date of Birth/Sex: Treating RN: 1950-06-13 (72 y.o. [name]) Yevonne Pax Primary Care Alleen Kehm: Cleon Dew Other Clinician: Referring Jarrette Dehner: Treating Prima Rayner/Extender: RO BSO Dorris Carnes, MICHA EL Franchot Mimes in Treatment: 2 Active Inactive Electronic Signature(s) Signed: 01/05/2023 4:14:21 PM By: Yevonne Pax RN Entered By: Yevonne Pax on 01/05/2023 13:14:20 -------------------------------------------------------------------------------- Pain Assessment Details Patient Name: Date of Service: Shawn Wagner MES Wagner. 01/05/2023 3:45 PM Medical Record Number: [id number] Patient Account Number: [id number] Date of Birth/Sex: Treating RN: 10-24-1950 (72 y.o. [name] Primary Care Haidyn Kilburg: Cleon Dew Other Clinician: Referring  Corion Sherrod: Treating Kristin Barcus/Extender: RO BSO Dorris Carnes, MICHA EL Franchot Mimes in Treatment: 2 Active Problems Location of Pain Severity and Description of Pain Patient Has Paino No Site Locations Auren, Heinze Shawn Wagner ([id number]) [phone].pdf Page 6 of 8 Pain Management and Medication Current Pain Management: Electronic Signature(s) Signed: 01/06/2023 3:34:18 PM By: Yevonne Pax RN Entered By: Yevonne Pax on 01/05/2023 12:47:04 -------------------------------------------------------------------------------- Patient/Caregiver Education Details Patient Name: Date of Service: Shawn Wagner MES Wagner. 10/30/2024andnbsp3:45 PM Medical Record Number: [id number] Patient Account Number: [id number] Date of Birth/Gender: Treating RN: 08-29-1950 (72 y.o. [name] Primary Care Physician: Cleon Dew Other Clinician: Referring Physician: Treating Physician/Extender: RO BSO Dorris Carnes, MICHA EL Franchot Mimes in Treatment: 2 Education Assessment Education Provided To: Patient Education Topics Provided Wound/Skin Impairment: Handouts: Other: discharge instructions Methods: Explain/Verbal Responses: State content correctly Electronic Signature(s) Signed: 01/06/2023 3:34:18 PM By: Yevonne Pax RN Entered By: Yevonne Pax on 01/05/2023 13:16:47 -------------------------------------------------------------------------------- Wound Assessment Details Patient Name: Date of Service: Shawn Wagner MES Wagner. 01/05/2023 3:45 PM Medical Record Number: [id number] Patient Account Number: [id number] Date of Birth/Sex: Treating RN: Sep 02, 1950 (72 y.o. [name] Primary Care Farrell Pantaleo: Cleon Dew Other Clinician: Referring Akari Defelice: Treating Alben Jepsen/Extender: RO BSO Dorris Carnes, MICHA EL Franchot Mimes in Treatment: 2 Wound Status Wound Number: 2 Primary Etiology: Jayco, Prajapati ([id number]) [id number]  Page 7 of 8 Wound Location: Right, Lateral Lower Leg Wound Status: Healed - Epithelialized Wounding Event: Trauma Date Acquired: 12/20/2022 Weeks Of Treatment: 2 Clustered Wound: No Wound Measurements Length: (cm) Width: (cm) Depth: (cm) Area: (cm) Volume: (cm) 0 % Reduction in Area: 100% 0 % Reduction in Volume: 100% 0 0 0 Wound Description Classification: Full Thickness Without Exposed Support Exudate Amount: Medium Exudate Type: Serosanguineous Exudate Color: red, brown Structures Treatment Notes Wound #2 (Lower Leg) Wound Laterality: Right, Lateral Cleanser Peri-Wound Care Topical Primary Dressing Secondary Dressing Secured With Compression Wrap Compression Stockings Add-Ons Electronic Signature(s) Signed: 01/06/2023 3:34:18 PM By: Yevonne Pax RN Entered By: Yevonne Pax on 01/05/2023 13:07:41 -------------------------------------------------------------------------------- Vitals Details Patient Name: Date of Service: Shawn Shawn Wagner, Shawn Wagner MES Wagner. 01/05/2023 3:45 PM Medical Record Number: [id number] Patient Account Number: [id number] Date of Birth/Sex: Treating RN: [birth year] (72 y.o. [name] Primary Care Beyonka Pitney: Cleon Dew Other Clinician: Referring Dawanna Grauberger: Treating Sadira Standard/Extender: RO BSO N, MICHA EL Franchot Mimes in  in Volume: Full Thickness Without Exposed Classification: Support Structures Medium Exudate Amount: Serosanguineous Exudate Type: red, brown Exudate Color:] [N/A:N/A N/A N/A N/A  N/A N/A N/A N/A N/A N/A N/A N/A N/A N/A N/A N/A N/A] Treatment Notes Electronic Signature(s) Signed: 01/05/2023 4:12:48 PM By: Yevonne Pax RN Entered By: Yevonne Pax on 01/05/2023 13:12:48 Shawn Wagner ([id number]) [id number] Page 5 of 8 -------------------------------------------------------------------------------- Multi-Disciplinary Care Plan Details Patient Name: Date of Service: Shawn Wagner MES Wagner. 01/05/2023 3:45 PM Medical Record Number: [id number] Patient Account Number: [id number] Date of Birth/Sex: Treating RN: 1950-06-13 (72 y.o. [name]) Yevonne Pax Primary Care Alleen Kehm: Cleon Dew Other Clinician: Referring Jarrette Dehner: Treating Prima Rayner/Extender: RO BSO Dorris Carnes, MICHA EL Franchot Mimes in Treatment: 2 Active Inactive Electronic Signature(s) Signed: 01/05/2023 4:14:21 PM By: Yevonne Pax RN Entered By: Yevonne Pax on 01/05/2023 13:14:20 -------------------------------------------------------------------------------- Pain Assessment Details Patient Name: Date of Service: Shawn Wagner MES Wagner. 01/05/2023 3:45 PM Medical Record Number: [id number] Patient Account Number: [id number] Date of Birth/Sex: Treating RN: 10-24-1950 (72 y.o. [name] Primary Care Haidyn Kilburg: Cleon Dew Other Clinician: Referring  Corion Sherrod: Treating Kristin Barcus/Extender: RO BSO Dorris Carnes, MICHA EL Franchot Mimes in Treatment: 2 Active Problems Location of Pain Severity and Description of Pain Patient Has Paino No Site Locations Auren, Heinze Shawn Wagner ([id number]) [phone].pdf Page 6 of 8 Pain Management and Medication Current Pain Management: Electronic Signature(s) Signed: 01/06/2023 3:34:18 PM By: Yevonne Pax RN Entered By: Yevonne Pax on 01/05/2023 12:47:04 -------------------------------------------------------------------------------- Patient/Caregiver Education Details Patient Name: Date of Service: Shawn Wagner MES Wagner. 10/30/2024andnbsp3:45 PM Medical Record Number: [id number] Patient Account Number: [id number] Date of Birth/Gender: Treating RN: 08-29-1950 (72 y.o. [name] Primary Care Physician: Cleon Dew Other Clinician: Referring Physician: Treating Physician/Extender: RO BSO Dorris Carnes, MICHA EL Franchot Mimes in Treatment: 2 Education Assessment Education Provided To: Patient Education Topics Provided Wound/Skin Impairment: Handouts: Other: discharge instructions Methods: Explain/Verbal Responses: State content correctly Electronic Signature(s) Signed: 01/06/2023 3:34:18 PM By: Yevonne Pax RN Entered By: Yevonne Pax on 01/05/2023 13:16:47 -------------------------------------------------------------------------------- Wound Assessment Details Patient Name: Date of Service: Shawn Wagner MES Wagner. 01/05/2023 3:45 PM Medical Record Number: [id number] Patient Account Number: [id number] Date of Birth/Sex: Treating RN: Sep 02, 1950 (72 y.o. [name] Primary Care Farrell Pantaleo: Cleon Dew Other Clinician: Referring Akari Defelice: Treating Alben Jepsen/Extender: RO BSO Dorris Carnes, MICHA EL Franchot Mimes in Treatment: 2 Wound Status Wound Number: 2 Primary Etiology: Jayco, Prajapati ([id number]) [id number]  Page 7 of 8 Wound Location: Right, Lateral Lower Leg Wound Status: Healed - Epithelialized Wounding Event: Trauma Date Acquired: 12/20/2022 Weeks Of Treatment: 2 Clustered Wound: No Wound Measurements Length: (cm) Width: (cm) Depth: (cm) Area: (cm) Volume: (cm) 0 % Reduction in Area: 100% 0 % Reduction in Volume: 100% 0 0 0 Wound Description Classification: Full Thickness Without Exposed Support Exudate Amount: Medium Exudate Type: Serosanguineous Exudate Color: red, brown Structures Treatment Notes Wound #2 (Lower Leg) Wound Laterality: Right, Lateral Cleanser Peri-Wound Care Topical Primary Dressing Secondary Dressing Secured With Compression Wrap Compression Stockings Add-Ons Electronic Signature(s) Signed: 01/06/2023 3:34:18 PM By: Yevonne Pax RN Entered By: Yevonne Pax on 01/05/2023 13:07:41 -------------------------------------------------------------------------------- Vitals Details Patient Name: Date of Service: Shawn Shawn Wagner, Shawn Wagner MES Wagner. 01/05/2023 3:45 PM Medical Record Number: [id number] Patient Account Number: [id number] Date of Birth/Sex: Treating RN: [birth year] (72 y.o. [name] Primary Care Beyonka Pitney: Cleon Dew Other Clinician: Referring Dawanna Grauberger: Treating Sadira Standard/Extender: RO BSO N, MICHA EL Franchot Mimes in

## 2023-01-06 NOTE — Progress Notes (Signed)
SHIRO, WOBIG ([id number]) [id number] Page 1 of 5 Visit Report for 01/05/2023 HPI Details Patient Name: Date of Service: [name] MES D. 01/05/2023 3:45 PM Medical Record Number: [id number] Patient Account Number: [id number] Date of Birth/Sex: Treating RN: 03-23-1950 (72 y.o. [name]) Yevonne Pax Primary Care Provider: Cleon Dew Other Clinician: Referring Provider: Treating Provider/Extender: RO BSO Dorris Carnes, MICHA EL Franchot Mimes in Treatment: 2 History of Present Illness HPI Description: 06/23/2022 Shawn Wagner is a 72 year old male with a past medical history of chronic pain syndrome, CAD, CVA, Chronic venous insufficiency and type 2 diabetes That presents to the clinic for a several year history of wounds to his right lower extremity. He states that this started out as a MRSA infection many years ago and the wounds will heal however he will pick at the scabs and reopen the areas. He currently denies signs of infection. He keeps the areas covered. He has compression stockings but does not wear them. 4/24; patient presents for follow-up. We have been using Hydrofera Blue with antibiotic ointment under compression therapy. His wounds have healed. READMISSION 12/22/2022 This patient returns to the clinic today with an area predominantly on his right lower leg however he also has eschared areas bilaterally. He has not been moisturizing his skin and he has not been wearing his compression stockings apparently wore them for about 2 days when he left the clinic last time in April. He apparently pulled some skin from the lateral part of his right leg that is the only wound that looks concerning here. Right next to this area is a large scar which he says was from a MRSA infection years ago. He is a diabetic with very significant peripheral neuropathy although he states he does not have pain or pruritus in his legs. His past medical history is  reviewed. He is a type II diabetic with peripheral neuropathy chronic venous insufficiency, hypertension, mitral valve regurgitation, stage III chronic renal failure and a history of coronary artery disease 10/23; everything is closed on the left leg. He has a 20/30 stocking. He will uses moisturizer on his skin in his stocking and we went over this. Hopefully the moisturizer with some steroid will help with the pruritus and the urged to pick at the wounds. He still has an area on the right lateral calf although this is smaller and looks clean we used Hydrofera Blue under Urgo K2 lite 10/30 all of his wounds are healed. He has 20/30 stockings. He has been using the triamcinolone 1-4 and Cetaphil that I prescribed for him last week and says that is help with the itching and the sensation to pick.. Electronic Signature(s) Signed: 01/05/2023 4:47:46 PM By: Baltazar Najjar MD Entered By: Baltazar Najjar on 01/05/2023 13:19:16 -------------------------------------------------------------------------------- Physical Exam Details Patient Name: Date of Service: [name] MES D. 01/05/2023 3:45 PM Medical Record Number: [id number] Patient Account Number: [id number] Date of Birth/Sex: Treating RN: 1950/05/13 (72 y.o. Shawn Wagner Primary Care Provider: Cleon Dew Other Clinician: Kem Kays ([id number]) [id number] Page 2 of 5 Referring Provider: Treating Provider/Extender: RO BSO N, MICHA EL Arrie Aran Weeks in Treatment: 2 Constitutional Patient is hypertensive.. Pulse regular and within target range for patient.Marland Kitchen Respirations regular, non-labored and within target range.. Temperature is normal and within the target range for the patient.Marland Kitchen appears in no distress. Notes Wound exam; everything is closed on both legs. His edema control is good. Skin changes of chronic venous insufficiency Electronic Signature(s) Signed:  01/05/2023 4:47:46 PM  By: Baltazar Najjar MD Entered By: Baltazar Najjar on 01/05/2023 13:20:17 -------------------------------------------------------------------------------- Physician Orders Details Patient Name: Date of Service: [name] MES D. 01/05/2023 3:45 PM Medical Record Number: [id number] Patient Account Number: [id number] Date of Birth/Sex: Treating RN: June 10, 1950 (72 y.o. Shawn Wagner Primary Care Provider: Cleon Dew Other Clinician: Referring Provider: Treating Provider/Extender: RO BSO Dorris Carnes, MICHA EL Franchot Mimes in Treatment: 2 The following information was scribed by: Yevonne Pax The information was scribed for: Maxwell Caul Verbal / Phone Orders: No Diagnosis Coding Discharge From [facility] Services Discharge from Wound Care Center Treatment Complete Wear compression garments daily. Put garments on first thing when you wake up and remove them before bed. Moisturize legs daily after removing compression garments. Elevate, Exercise Daily and A void Standing for Long Periods of Time. Electronic Signature(s) Signed: 01/05/2023 4:13:18 PM By: Yevonne Pax RN Signed: 01/05/2023 4:47:46 PM By: Baltazar Najjar MD Entered By: Yevonne Pax on 01/05/2023 13:13:18 -------------------------------------------------------------------------------- Problem List Details Patient Name: Date of Service: [name] MES D. 01/05/2023 3:45 PM Medical Record Number: [id number] Patient Account Number: [id number] Date of Birth/Sex: Treating RN: 18-Jun-1950 (72 y.o. Shawn Wagner Primary Care Provider: Cleon Dew Other Clinician: Referring Provider: Treating Provider/Extender: Chauncey Mann, MICHA EL Franchot Mimes in Treatment: 2 Shawn Wagner, Shawn Wagner ([id number]) [id number] Page 3 of 5 Active Problems ICD-10 Encounter Code Description Active Date MDM Diagnosis I87.333 Chronic venous hypertension (idiopathic) with ulcer and inflammation  of 12/22/2022 No Yes bilateral lower extremity L97.811 Non-pressure chronic ulcer of other part of right lower leg limited to breakdown 12/22/2022 No Yes of skin L97.821 Non-pressure chronic ulcer of other part of left lower leg limited to breakdown 12/22/2022 No Yes of skin Inactive Problems Resolved Problems Electronic Signature(s) Signed: 01/05/2023 4:47:46 PM By: Baltazar Najjar MD Entered By: Baltazar Najjar on 01/05/2023 13:18:30 -------------------------------------------------------------------------------- Progress Note Details Patient Name: Date of Service: [name], Shawn Wagner MES D. 01/05/2023 3:45 PM Medical Record Number: [id number] Patient Account Number: [id number] Date of Birth/Sex: Treating RN: September 28, 1950 (72 y.o. Shawn Wagner Primary Care Provider: Cleon Dew Other Clinician: Referring Provider: Treating Provider/Extender: RO BSO N, MICHA EL Franchot Mimes in Treatment: 2 Subjective History of Present Illness (HPI) 06/23/2022 Shawn Wagner is a 72 year old male with a past medical history of chronic pain syndrome, CAD, CVA, Chronic venous insufficiency and type 2 diabetes That presents to the clinic for a several year history of wounds to his right lower extremity. He states that this started out as a MRSA infection many years ago and the wounds will heal however he will pick at the scabs and reopen the areas. He currently denies signs of infection. He keeps the areas covered. He has compression stockings but does not wear them. 4/24; patient presents for follow-up. We have been using Hydrofera Blue with antibiotic ointment under compression therapy. His wounds have healed. READMISSION 12/22/2022 This patient returns to the clinic today with an area predominantly on his right lower leg however he also has eschared areas bilaterally. He has not been moisturizing his skin and he has not been wearing his compression stockings apparently wore them for  about 2 days when he left the clinic last time in April. He apparently pulled some skin from the lateral part of his right leg that is the only wound that looks concerning here. Right next to this area is a large scar which he says was from a  01/05/2023 4:47:46 PM  By: Baltazar Najjar MD Entered By: Baltazar Najjar on 01/05/2023 13:20:17 -------------------------------------------------------------------------------- Physician Orders Details Patient Name: Date of Service: [name] MES D. 01/05/2023 3:45 PM Medical Record Number: [id number] Patient Account Number: [id number] Date of Birth/Sex: Treating RN: June 10, 1950 (72 y.o. Shawn Wagner Primary Care Provider: Cleon Dew Other Clinician: Referring Provider: Treating Provider/Extender: RO BSO Dorris Carnes, MICHA EL Franchot Mimes in Treatment: 2 The following information was scribed by: Yevonne Pax The information was scribed for: Maxwell Caul Verbal / Phone Orders: No Diagnosis Coding Discharge From [facility] Services Discharge from Wound Care Center Treatment Complete Wear compression garments daily. Put garments on first thing when you wake up and remove them before bed. Moisturize legs daily after removing compression garments. Elevate, Exercise Daily and A void Standing for Long Periods of Time. Electronic Signature(s) Signed: 01/05/2023 4:13:18 PM By: Yevonne Pax RN Signed: 01/05/2023 4:47:46 PM By: Baltazar Najjar MD Entered By: Yevonne Pax on 01/05/2023 13:13:18 -------------------------------------------------------------------------------- Problem List Details Patient Name: Date of Service: [name] MES D. 01/05/2023 3:45 PM Medical Record Number: [id number] Patient Account Number: [id number] Date of Birth/Sex: Treating RN: 18-Jun-1950 (72 y.o. Shawn Wagner Primary Care Provider: Cleon Dew Other Clinician: Referring Provider: Treating Provider/Extender: Chauncey Mann, MICHA EL Franchot Mimes in Treatment: 2 Shawn Wagner, Shawn Wagner ([id number]) [id number] Page 3 of 5 Active Problems ICD-10 Encounter Code Description Active Date MDM Diagnosis I87.333 Chronic venous hypertension (idiopathic) with ulcer and inflammation  of 12/22/2022 No Yes bilateral lower extremity L97.811 Non-pressure chronic ulcer of other part of right lower leg limited to breakdown 12/22/2022 No Yes of skin L97.821 Non-pressure chronic ulcer of other part of left lower leg limited to breakdown 12/22/2022 No Yes of skin Inactive Problems Resolved Problems Electronic Signature(s) Signed: 01/05/2023 4:47:46 PM By: Baltazar Najjar MD Entered By: Baltazar Najjar on 01/05/2023 13:18:30 -------------------------------------------------------------------------------- Progress Note Details Patient Name: Date of Service: [name], Shawn Wagner MES D. 01/05/2023 3:45 PM Medical Record Number: [id number] Patient Account Number: [id number] Date of Birth/Sex: Treating RN: September 28, 1950 (72 y.o. Shawn Wagner Primary Care Provider: Cleon Dew Other Clinician: Referring Provider: Treating Provider/Extender: RO BSO N, MICHA EL Franchot Mimes in Treatment: 2 Subjective History of Present Illness (HPI) 06/23/2022 Shawn Wagner is a 72 year old male with a past medical history of chronic pain syndrome, CAD, CVA, Chronic venous insufficiency and type 2 diabetes That presents to the clinic for a several year history of wounds to his right lower extremity. He states that this started out as a MRSA infection many years ago and the wounds will heal however he will pick at the scabs and reopen the areas. He currently denies signs of infection. He keeps the areas covered. He has compression stockings but does not wear them. 4/24; patient presents for follow-up. We have been using Hydrofera Blue with antibiotic ointment under compression therapy. His wounds have healed. READMISSION 12/22/2022 This patient returns to the clinic today with an area predominantly on his right lower leg however he also has eschared areas bilaterally. He has not been moisturizing his skin and he has not been wearing his compression stockings apparently wore them for  about 2 days when he left the clinic last time in April. He apparently pulled some skin from the lateral part of his right leg that is the only wound that looks concerning here. Right next to this area is a large scar which he says was from a  MRSA infection years ago. He is a diabetic with very significant peripheral neuropathy although he states he does not have pain or pruritus in his legs. His past medical history is reviewed. He is a type II diabetic with peripheral neuropathy chronic venous insufficiency, hypertension, mitral valve regurgitation, stage III chronic renal failure and a history of coronary artery disease 10/23; everything is closed on the left leg. He has a 20/30 stocking. He will uses moisturizer on his skin in his stocking and we went over this. Hopefully the moisturizer with some steroid will help with the pruritus and the urged to pick at the wounds. Shawn Wagner, Shawn Wagner ([id number]) [id number] Page 4 of 5 He still has an area on the right lateral calf although this is smaller and looks clean we used Hydrofera Blue under Urgo K2 lite 10/30 all of his wounds are healed. He has 20/30 stockings. He has been using the triamcinolone 1-4 and Cetaphil that I prescribed for him last week and says that is help with the itching and the sensation to pick.. Objective Constitutional Patient is hypertensive.. Pulse regular and within target range for patient.Marland Kitchen Respirations regular, non-labored and within target range.. Temperature is normal and within the target range for the patient.Marland Kitchen appears in no distress. Vitals Time Taken: 3:46 PM, Height: 73 in, Weight: 187 lbs, BMI: 24.7, Temperature: 98.1 F, Pulse: 65 bpm, Respiratory Rate: 18 breaths/min, Blood Pressure: 158/80 mmHg. General Notes: Wound exam; everything is closed on both legs. His edema control is good. Skin changes of chronic venous insufficiency Integumentary (Hair, Skin) Wound #2 status is Healed -  Epithelialized. Original cause of wound was Trauma. The date acquired was: 12/20/2022. The wound has been in treatment 2 weeks. The wound is located on the Right,Lateral Lower Leg. The wound measures 0cm length x 0cm width x 0cm depth; 0cm^2 area and 0cm^3 volume. There is a medium amount of serosanguineous drainage noted. Assessment Active Problems ICD-10 Chronic venous hypertension (idiopathic) with ulcer and inflammation of bilateral lower extremity Non-pressure chronic ulcer of other part of right lower leg limited to breakdown of skin Non-pressure chronic ulcer of other part of left lower leg limited to breakdown of skin Plan Discharge From [facility] Services: Discharge from Wound Care Center Treatment Complete Wear compression garments daily. Put garments on first thing when you wake up and remove them before bed. Moisturize legs daily after removing compression garments. Elevate, Exercise Daily and Avoid Standing for Long Periods of Time. 1. At the patient is healed. He can be discharged from the clinic 2. He has the moisturizer and steroid that I prescribed. I told him that after that is finished he should go to straight Cetaphil to moisturize his skin nightly 3. He has 20/30 below-knee stockings Electronic Signature(s) Signed: 01/05/2023 4:47:46 PM By: Baltazar Najjar MD Entered By: Baltazar Najjar on 01/05/2023 13:21:25 Kem Kays ([id number]) [id number] Page 5 of 5 -------------------------------------------------------------------------------- SuperBill Details Patient Name: Date of Service: [name] MES D. 01/05/2023 Medical Record Number: [id number] Patient Account Number: [id number] Date of Birth/Sex: Treating RN: [birth year] (72 y.o. [name]) Yevonne Pax Primary Care Provider: Cleon Dew Other Clinician: Referring Provider: Treating Provider/Extender: RO BSO Dorris Carnes, MICHA EL Franchot Mimes in Treatment: 2 Diagnosis  Coding ICD-10 Codes Code Description [phone] Chronic venous hypertension (idiopathic) with ulcer and inflammation of bilateral lower extremity L97.811 Non-pressure chronic ulcer of other part of right lower leg limited to breakdown of skin L97.821 Non-pressure chronic ulcer of other part of left lower leg

## 2023-01-10 ENCOUNTER — Ambulatory Visit: Payer: Medicare HMO | Admitting: Physician Assistant

## 2023-08-18 LAB — GLUCOSE, POCT (MANUAL RESULT ENTRY): POC Glucose: 213 mg/dL — AB (ref 70–99)

## 2023-08-18 NOTE — Congregational Nurse Program (Signed)
  Dept: [phone]   Congregational Nurse Program Note  Date of Encounter: 08/18/2023  Past Medical History: Past Medical History:  Diagnosis Date   Diabetes mellitus without complication [facility])     Encounter Details:  Community Questionnaire - 08/18/23 2005       Questionnaire   Ask client: Do you give verbal consent for me to treat you today? Yes    Student Assistance [place] Nurse    Location Patient Served  Shawn Wagner    Encounter Setting CN site    Population Status Unknown    Engineer, building services or [place] Insurance;Medicare    Insurance/Financial Assistance Referral N/A    Medication N/A    Medical Provider Yes    Screening Referrals Made N/A    Medical Referrals Made N/A    Medical Appointment Completed N/A    CNP Interventions Advocate/Support    Screenings CN Performed Blood Pressure;Blood Glucose    ED Visit Averted N/A    Life-Saving Intervention Made N/A          Today's Vitals   08/18/23 2003  BP: 101/60   There is no height or weight on file to calculate BMI.   Patient last ate at 9:30 am.  Vital signs were completed by Adin Honour Student Nurse under supervision of Nurse Preceptor.

## 2023-08-23 DIAGNOSIS — J849 Interstitial pulmonary disease, unspecified: Secondary | ICD-10-CM | POA: Insufficient documentation

## 2023-09-01 NOTE — Congregational Nurse Program (Signed)
  Dept: [phone]   Congregational Nurse Program Note  Date of Encounter: 09/01/2023  Past Medical History: Past Medical History:  Diagnosis Date   Diabetes mellitus without complication [facility])     Encounter Details:  Community Questionnaire - 09/01/23 1116       Questionnaire   Ask client: Do you give verbal consent for me to treat you today? Yes    Student Assistance N/A    Location [facility] Served  [facility]    Encounter Setting CN site    Population Status Unknown    Insurance Medicare;Private or VA Insurance    Insurance/Financial Assistance Referral N/A    Medication N/A    Medical Provider Yes    Screening Referrals Made N/A    Medical Referrals Made N/A    Medical Appointment Completed N/A    CNP Interventions Advocate/Support    Screenings CN Performed Blood Pressure    ED Visit Averted N/A    Life-Saving Intervention Made N/A          Today's Vitals   09/01/23 1114  BP: (!) 100/59  Pulse: 87  SpO2: 98%   There is no height or weight on file to calculate BMI.

## 2023-09-02 ENCOUNTER — Ambulatory Visit
Admission: EM | Admit: 2023-09-02 | Discharge: 2023-09-02 | Disposition: A | Discharge status: Home / Self Care | Attending: Family Medicine | Admitting: Family Medicine

## 2023-09-02 ENCOUNTER — Encounter: Payer: Self-pay | Admitting: Emergency Medicine

## 2023-09-02 DIAGNOSIS — L03116 Cellulitis of left lower limb: Secondary | ICD-10-CM

## 2023-09-02 DIAGNOSIS — S80262A Insect bite (nonvenomous), left knee, initial encounter: Secondary | ICD-10-CM | POA: Diagnosis not present

## 2023-09-02 DIAGNOSIS — J40 Bronchitis, not specified as acute or chronic: Secondary | ICD-10-CM

## 2023-09-02 DIAGNOSIS — W57XXXA Bitten or stung by nonvenomous insect and other nonvenomous arthropods, initial encounter: Secondary | ICD-10-CM | POA: Diagnosis not present

## 2023-09-02 MED ORDER — PREDNISONE 10 MG (21) PO TBPK: ORAL_TABLET | Freq: Every day | ORAL | 0 refills | Status: AC

## 2023-09-02 MED ORDER — DOXYCYCLINE HYCLATE 100 MG PO CAPS: 100.0000 mg | ORAL_CAPSULE | Freq: Two times a day (BID) | ORAL | 0 refills | Status: AC

## 2023-09-02 MED ORDER — CEFDINIR 300 MG PO CAPS: 300.0000 mg | ORAL_CAPSULE | Freq: Two times a day (BID) | ORAL | 0 refills | Status: AC

## 2023-09-02 NOTE — ED Triage Notes (Addendum)
 Pt c/o insect bite on left knee. Noticed it about 5 days ago. Area is red swollen and painful.  Pt also states he has a cough. He was seen in the ED on 08/30/23. For n/v. He had lab, CT and chest xray.   He states his BP has been low and O2 has been low. Denies fever.

## 2023-09-02 NOTE — ED Provider Notes (Signed)
 [facility]    CSN: [id number] Arrival date & time: 09/02/23  1322      History   Chief Complaint Chief Complaint  Patient presents with   Insect Bite   Cough    HPI Shawn Wagner is a 73 y.o. male.   HPI  History obtained from the patient and his wife.  Jaydrian who reports finding a red pustular spot on his left knee after sitting outside at a cookout. Redness has spread after squeezing the area with his fingernails.   Has pain whenever something touches the area even his jeans. Wife reports they have been putting a salve on the area without improvement.   Pt reports history of whole body infection whereas he was admitted to the hospital for weeks. Says the idea of another infection worries him especially since he is a diabetic. He is concerned that he may have an infection in his leg.   Denies recent fever.  Endorses worsening cough. Saw his lung doctor recently. Has frequent coughing fits. Says he had a lung infection from exposure to moldy soybeans previously. Worked on a farm.       Past Medical History:  Diagnosis Date   Diabetes mellitus without complication [facility])     Patient Active Problem List   Diagnosis Date Noted   Chronic pain syndrome 07/25/2019   Pharmacologic therapy 07/25/2019   Disorder of skeletal system 07/25/2019   Problems influencing health status 07/25/2019   Neurogenic pruritus 07/17/2019   Chronic pruritic rash in adult 07/10/2019   Cerebrovascular accident (CVA) (HCC) 03/28/2018   Altered mental status 03/15/2018   History of shingles 03/15/2018   Diabetic neuropathy, painful (HCC) 06/21/2016   Neurodermatitis 06/21/2016   Neuropathy 05/24/2016   Arachnoid cyst 01/21/2016   Pulmonary nodule 01/21/2016   Dupuytren's contracture of both hands 06/04/2013   CAD (coronary artery disease) 08/24/2012   Sebaceous cyst 06/19/2010   Depressive disorder 06/07/2010   Low back pain 06/07/2010   Lichenification and lichen simplex  chronicus 04/29/2010   Pityriasis versicolor 07/11/2009   Diabetes mellitus (HCC) 05/31/2008   Hypercholesteremia 05/31/2008   Hypertension, benign 05/31/2008    History reviewed. No pertinent surgical history.     Home Medications    Prior to Admission medications   Medication Sig Start Date End Date Taking? Authorizing Provider  aspirin 81 MG chewable tablet Chew by mouth. 08/22/22  Yes [provider]  atorvastatin (LIPITOR) 80 MG tablet Take by mouth. 03/28/18 09/02/23 Yes [provider]  cefdinir (OMNICEF) 300 MG capsule Take 1 capsule (300 mg total) by mouth 2 (two) times daily. 09/02/23  Yes Ilanna Deihl, DO  doxepin (SINEQUAN) 10 MG capsule Take by mouth. 10/07/22 10/07/23 Yes [provider]  doxycycline  (VIBRAMYCIN ) 100 MG capsule Take 1 capsule (100 mg total) by mouth 2 (two) times daily. 09/02/23  Yes Raquel Racey, DO  FLUoxetine (PROZAC) 10 MG capsule Take 10 mg by mouth. 01/21/16 09/02/23 Yes [provider]  fluticasone (FLONASE) 50 MCG/ACT nasal spray  03/08/19  Yes [provider]  gabapentin (NEURONTIN) 300 MG capsule Take 300 mg by mouth. 01/27/16  Yes [provider]  insulin  NPH-regular Human (NOVOLIN 70/30) (70-30) 100 UNIT/ML injection Take 30 units in the morning before breakfast and 30 units in the evening before dinner. 01/30/19  Yes [provider]  LANTUS 100 UNIT/ML injection Inject into the skin. 08/26/22  Yes [provider]  oxyCODONE -acetaminophen  (PERCOCET/ROXICET) 5-325 MG tablet Take 1 tablet by mouth  every 6 (six) hours as needed for severe pain. 09/14/22  Yes Bernardino Ditch, NP  predniSONE (STERAPRED UNI-PAK 21 TAB) 10 MG (21) TBPK tablet Take by mouth daily. Take 6 tabs by mouth daily for 1, then 5 tabs for 1 day, then 4 tabs for 1 day, then 3 tabs for 1 day, then 2 tabs for 1 day, then 1 tab for 1 day. 09/02/23  Yes Hyde Sires, DO  traZODone (DESYREL) 50 MG tablet  05/15/18  Yes  [provider]  Zinc Oxide 40 % PSTE Apply topically. 08/21/22  Yes [provider]  acetaminophen  (TYLENOL ) 500 MG tablet Take by mouth. 11/19/21   [provider]  ascorbic acid (VITAMIN C) 1000 MG tablet Take 1 tablet by mouth daily.    [provider]  aspirin 81 MG EC tablet Take by mouth. 10/24/09   [provider]  blood glucose meter kit and supplies KIT Dispense based on patient and insurance preference. Use up to four times daily as directed. (FOR ICD-9 250.00, 250.01). 01/07/15   Audie Alm ORN, MD  blood glucose meter kit and supplies Use as instructed. Please dispense a glucometer (old glucometer is not working). 08/30/22   [provider]  Continuous Glucose Receiver (DEXCOM G6 RECEIVER) DEVI Dispense DexCom G6 Receiver 07/26/22   [provider]  Continuous Glucose Sensor (DEXCOM G6 SENSOR) MISC Dispense DexCom G6 sensors; Use 1 sensor q 10 days 07/26/22   [provider]  Continuous Glucose Transmitter (DEXCOM G6 TRANSMITTER) MISC DexCom G6 transmitter; Use 1 every 90 days 07/26/22   [provider]  DULoxetine (CYMBALTA) 30 MG capsule Take by mouth. Patient not taking: Reported on 07/22/2020    [provider]  glipiZIDE (GLUCOTROL) 5 MG tablet Take 5 mg by mouth. 01/21/16 05/24/18  [provider]  glucose blood (ACCU-CHEK GUIDE) test strip by Other route Three (3) times a day before meals. Accu-chek Guide. Dx. E11.65. 05/15/18   [provider]  hydrOXYzine (ATARAX) 25 MG tablet Take 25 mg by mouth daily. 06/22/22   [provider]  insulin  aspart (NOVOLOG ) 100 UNIT/ML injection Inject into the skin.    [provider]  insulin  NPH Human (HUMULIN N,NOVOLIN N) 100 UNIT/ML injection Inject into the skin. 01/27/16 05/24/18  [provider]  lisinopril (PRINIVIL,ZESTRIL) 2.5 MG tablet Take by mouth. Patient not taking: Reported on 05/31/2022 03/28/18   [provider]  loratadine (CLARITIN) 10 MG tablet Take by mouth. 06/13/18 06/13/19  [provider]  losartan (COZAAR) 25 MG tablet Take 25 mg by mouth daily. Patient not taking: Reported on 07/22/2020 01/17/19   [provider]  metFORMIN  (GLUCOPHAGE ) 1000 MG tablet Take 1 tablet (1,000 mg total) by mouth 2 (two) times daily with a meal. 01/02/15 05/24/18  Dicky Anes, MD  metoprolol tartrate (LOPRESSOR) 25 MG tablet Take by mouth. 03/28/18   [provider]  olmesartan (BENICAR) 20 MG tablet Take 20 mg by mouth daily.    [provider]  omega-3 acid ethyl esters (LOVAZA) 1 g capsule Take by mouth. 11/06/18 11/06/19  [provider]  omeprazole (PRILOSEC) 20 MG capsule  04/13/18   [provider]  ondansetron (ZOFRAN-ODT) 4 MG disintegrating tablet Take 4 mg by mouth every 8 (eight) hours as needed.    [provider]  terbinafine (LAMISIL) 250 MG tablet  05/02/18   [provider]  traZODone (DESYREL) 100 MG tablet Take by mouth. 10/07/22 11/06/22  [provider]  triamcinolone  ointment (KENALOG ) 0.1 % Apply topically. 10/07/22 10/07/23  [provider]    Family History Family History  Problem Relation Age of Onset   Emphysema Mother    Prostate cancer Father     Social History Social History   Tobacco Use   Smoking status: Never   Smokeless tobacco: Never  Vaping Use   Vaping status: Never Used  Substance Use Topics   Alcohol use: No   Drug use: Never     Allergies   Heparin, Vicodin [hydrocodone -acetaminophen ], and Hydrocodone -acetaminophen    Review of Systems Review of Systems: negative unless otherwise stated in HPI.      Physical Exam Triage Vital Signs ED Triage Vitals  Encounter Vitals Group     BP 09/02/23 1343 (!) 149/54     Girls Systolic BP Percentile --      Girls Diastolic BP Percentile --      Boys Systolic BP Percentile --      Boys Diastolic BP Percentile --      Pulse  Rate 09/02/23 1343 70     Resp 09/02/23 1343 18     Temp 09/02/23 1343 98.6 F (37 C)     Temp Source 09/02/23 1343 Oral     SpO2 09/02/23 1343 96 %     Weight 09/02/23 1337 169 lb 15.6 oz (77.1 kg)     Height 09/02/23 1337 6' 1 (1.854 m)     Head Circumference --      Peak Flow --      Pain Score 09/02/23 1337 4     Pain Loc --      Pain Education --      Exclude from Growth Chart --    No data found.  Updated Vital Signs BP (!) 149/54 (BP Location: Right Arm)   Pulse 70   Temp 98.6 F (37 C) (Oral)   Resp 18   Ht 6' 1 (1.854 m)   Wt 77.1 kg   SpO2 96%   BMI 22.43 kg/m   Visual Acuity Right Eye Distance:   Left Eye Distance:   Bilateral Distance:    Right Eye Near:   Left Eye Near:    Bilateral Near:     Physical Exam GEN:     alert, non-toxic appearing elderly male in no distress    HENT:  mucus membranes moist RESP:  no increased work of breathing, rhonchi bilaterally CVS:   regular rate and rhythm Skin:   warm and dry, 4 cm area of erythema with 2 pustules on the anterior knee     UC Treatments / Results  Labs (all labs ordered are listed, but only abnormal results are displayed) Labs Reviewed - No data to display  EKG   Radiology No results found.  Procedures Procedures (including critical care time)  Medications Ordered in UC Medications - No data to display  Initial Impression / Assessment and Plan / UC Course  I have reviewed the triage vital signs and the nursing notes.  Pertinent labs & imaging results that were available during my care of the patient were reviewed by me and considered in my medical decision making (see chart for details).       Pt is a 73 y.o. male who has PMHx of ILD, asbestosis exposure, MRSA bacteremia complicated by infective endocarditis and cavitary septic emboli, T2MD, CKD, MDD presents for worsening chronic cough that is not improving.  Terry is  afebrile here without recent antipyretics. Satting well on  room air. Reviewed notes and labs from 08/24/23 [facility] pulmonology visit.  Overall pt is  non-toxic appearing, well hydrated, without respiratory distress. Pulmonary exam is remarkable for rhonchi with frequent cough.  After shared decision making, we will not pursue chest x-ray as he has a HRCT planned soon.  COVID  and influenza testing deferred due to length of symptoms. Treat acute bronchitis with steroids and Cefdinir as below.   Typical duration of symptoms discussed. Follow up with his pulmonologist.    While at his [facility] ED visit he was seen for left knee insect bite. He was advised to start using bacitracin to prevent infection. Today, He has evidence of left knee/distal thigh cellulitis. Treat with doxycycline  has he has history of MRSA.   Return and ED precautions given and patient voiced understanding.  Discussed MDM, treatment plan and plan for follow-up with patient who agrees with plan.      Final Clinical Impressions(s) / UC Diagnoses   Final diagnoses:  Bronchitis  Insect bite of left knee, initial encounter  Cellulitis of left lower extremity     Discharge Instructions      Stop by the pharmacy to pick up your prescriptions.  Follow up with your lung doctor or return to the urgent care, if not improving.      ED Prescriptions     Medication Sig Dispense Auth. Provider   cefdinir (OMNICEF) 300 MG capsule Take 1 capsule (300 mg total) by mouth 2 (two) times daily. 14 capsule Laurisa Sahakian, DO   doxycycline  (VIBRAMYCIN ) 100 MG capsule Take 1 capsule (100 mg total) by mouth 2 (two) times daily. 14 capsule Natan Hartog, DO   predniSONE (STERAPRED UNI-PAK 21 TAB) 10 MG (21) TBPK tablet Take by mouth daily. Take 6 tabs by mouth daily for 1, then 5 tabs for 1 day, then 4 tabs for 1 day, then 3 tabs for 1 day, then 2 tabs for 1 day, then 1 tab for 1 day. 21 tablet Elka Satterfield, DO      PDMP not reviewed this encounter.   Kanya Potteiger, DO 09/02/23 1516

## 2023-09-02 NOTE — Discharge Instructions (Addendum)
 Stop by the pharmacy to pick up your prescriptions.  Follow up with your lung doctor or return to the urgent care, if not improving.

## 2023-09-12 ENCOUNTER — Ambulatory Visit: Admission: EM | Admit: 2023-09-12 | Discharge: 2023-09-12

## 2023-09-12 DIAGNOSIS — R6884 Jaw pain: Secondary | ICD-10-CM

## 2023-09-12 NOTE — Discharge Instructions (Addendum)
 As we discussed, I am concerned that you may have broken your jaw.  Please go to [facility] ER to be evaluated by the dentist or maxillofacial surgeon on-call.  Nothing to eat or drink until after you have been evaluated in the ER.

## 2023-09-12 NOTE — ED Triage Notes (Addendum)
 Pt c/o jaw pain d/t fall x1 day. States was walking towards kitchen & tripped over his dog.

## 2023-09-12 NOTE — ED Provider Notes (Signed)
 [facility]    CSN: [id number] Arrival date & time: 09/12/23  1814      History   Chief Complaint Chief Complaint  Patient presents with   Fall   Jaw Pain    HPI COMMODORE BELLEW is a 73 y.o. male.   HPI  73 year old male with medical history significant for diabetes, interstitial lung disease, and mild nonproliferative diabetic retinopathy of both eyes presents for evaluation of jaw pain after suffering a ground-level fall yesterday.  He denies loss of consciousness, headache, change in vision, nausea, or vomiting.  He feels like his teeth do not meet together and discussed significant pain if he tries to chew.  Past Medical History:  Diagnosis Date   Diabetes mellitus without complication [facility])     Patient Active Problem List   Diagnosis Date Noted   ILD (interstitial lung disease) (HCC) 08/23/2023   Shortness of breath 08/18/2022   IPMN (intraductal papillary mucinous neoplasm) 06/28/2022   Mitral valve insufficiency 05/13/2022   Fecal impaction of rectum (HCC) 04/23/2022   Acute bacterial endocarditis 03/30/2022   Ocular syphilis 03/30/2022   Cavitary pneumonia 03/08/2022   Cellulitis of back except buttock 03/08/2022   RSV (respiratory syncytial virus infection) 03/08/2022   Bedbug bite 11/15/2021   Hyperkalemia 11/15/2021   Iron deficiency anemia 11/15/2021   Problem related to social environment 11/15/2021   Puncture wound of plantar aspect of right foot 11/15/2021   Neck pain 09/16/2021   Mild nonproliferative diabetic retinopathy of both eyes without macular edema associated with type 2 diabetes mellitus (HCC) 06/15/2021   Nuclear sclerotic cataract, left 06/15/2021   PVD (posterior vitreous detachment), bilateral 06/15/2021   Stage 3 chronic kidney disease (HCC) 11/04/2020   Chronic pain disorder 07/25/2019   Routine history and physical examination of adult 07/25/2019   Disorder of skeletal system 07/25/2019   Problems influencing health  status 07/25/2019   Neurogenic pruritus 07/17/2019   Chronic pruritic rash in adult 07/10/2019   Cerebrovascular accident (CVA) (HCC) 03/28/2018   Memory deficit following cerebral infarction 03/28/2018   Altered mental status 03/15/2018   History of shingles 03/15/2018   Diabetic neuropathy, painful (HCC) 06/21/2016   Sepsis (HCC) 06/21/2016   Neuropathy 05/24/2016   Arachnoid cyst 01/21/2016   Pulmonary nodule 01/21/2016   Dupuytren's contracture of both hands 06/04/2013   CAD (coronary artery disease) 08/24/2012   Sebaceous cyst 06/19/2010   Depressive disorder 06/07/2010   Low back pain 06/07/2010   Lichenification and lichen simplex chronicus 04/29/2010   Pityriasis versicolor 07/11/2009   Diabetes mellitus (HCC) 05/31/2008   Hypercholesteremia 05/31/2008   Hypertension, benign 05/31/2008    No past surgical history on file.     Home Medications    Prior to Admission medications   Medication Sig Start Date End Date Taking? Authorizing Provider  acetaminophen  (TYLENOL ) 500 MG tablet Take by mouth. 11/19/21   [provider]  ascorbic acid (VITAMIN C) 1000 MG tablet Take 1 tablet by mouth daily.    [provider]  aspirin 81 MG chewable tablet Chew by mouth. 08/22/22   [provider]  aspirin 81 MG EC tablet Take by mouth. 10/24/09   [provider]  atorvastatin (LIPITOR) 80 MG tablet Take by mouth. 03/28/18 09/02/23  [provider]  blood glucose meter kit and supplies KIT Dispense based on patient and insurance preference. Use up to four times daily as directed. (FOR ICD-9 250.00, 250.01). 01/07/15   Audie Alm ORN, MD  blood  glucose meter kit and supplies Use as instructed. Please dispense a glucometer (old glucometer is not working). 08/30/22   [provider]  cefdinir  (OMNICEF ) 300 MG capsule Take 1 capsule (300 mg total) by mouth 2 (two) times daily. 09/02/23   Brimage, Vondra, DO  Continuous Glucose Receiver  (DEXCOM G6 RECEIVER) DEVI Dispense DexCom G6 Receiver 07/26/22   [provider]  Continuous Glucose Sensor (DEXCOM G6 SENSOR) MISC Dispense DexCom G6 sensors; Use 1 sensor q 10 days 07/26/22   [provider]  Continuous Glucose Transmitter (DEXCOM G6 TRANSMITTER) MISC DexCom G6 transmitter; Use 1 every 90 days 07/26/22   [provider]  doxepin (SINEQUAN) 10 MG capsule Take by mouth. 10/07/22 10/07/23  [provider]  doxycycline  (VIBRAMYCIN ) 100 MG capsule Take 1 capsule (100 mg total) by mouth 2 (two) times daily. 09/02/23   Brimage, Vondra, DO  DULoxetine (CYMBALTA) 30 MG capsule Take by mouth. Patient not taking: Reported on 07/22/2020    [provider]  FLUoxetine (PROZAC) 10 MG capsule Take 10 mg by mouth. 01/21/16 09/02/23  [provider]  fluticasone OREN) 50 MCG/ACT nasal spray  03/08/19   [provider]  gabapentin (NEURONTIN) 300 MG capsule Take 300 mg by mouth. 01/27/16   [provider]  glipiZIDE (GLUCOTROL) 5 MG tablet Take 5 mg by mouth. 01/21/16 05/24/18  [provider]  glucose blood (ACCU-CHEK GUIDE) test strip by Other route Three (3) times a day before meals. Accu-chek Guide. Dx. E11.65. 05/15/18   [provider]  hydrOXYzine (ATARAX) 25 MG tablet Take 25 mg by mouth daily. 06/22/22   [provider]  insulin  aspart (NOVOLOG ) 100 UNIT/ML injection Inject into the skin.    [provider]  insulin  NPH Human (HUMULIN N,NOVOLIN N) 100 UNIT/ML injection Inject into the skin. 01/27/16 05/24/18  [provider]  insulin  NPH-regular Human (NOVOLIN 70/30) (70-30) 100 UNIT/ML injection Take 30 units in the morning before breakfast and 30 units in the evening before dinner. 01/30/19   [provider]  LANTUS 100 UNIT/ML injection Inject into the skin. 08/26/22   [provider]  lisinopril (PRINIVIL,ZESTRIL) 2.5 MG tablet Take by mouth. Patient not taking:  Reported on 05/31/2022 03/28/18   [provider]  loratadine (CLARITIN) 10 MG tablet Take by mouth. 06/13/18 06/13/19  [provider]  losartan (COZAAR) 25 MG tablet Take 25 mg by mouth daily. Patient not taking: Reported on 07/22/2020 01/17/19   [provider]  metFORMIN  (GLUCOPHAGE ) 1000 MG tablet Take 1 tablet (1,000 mg total) by mouth 2 (two) times daily with a meal. 01/02/15 05/24/18  Dicky Anes, MD  metoprolol tartrate (LOPRESSOR) 25 MG tablet Take by mouth. 03/28/18   [provider]  olmesartan (BENICAR) 20 MG tablet Take 20 mg by mouth daily.    [provider]  omega-3 acid ethyl esters (LOVAZA) 1 g capsule Take by mouth. 11/06/18 11/06/19  [provider]  omeprazole (PRILOSEC) 20 MG capsule  04/13/18   [provider]  ondansetron (ZOFRAN-ODT) 4 MG disintegrating tablet Take 4 mg by mouth every 8 (eight) hours as needed.    [provider]  oxyCODONE -acetaminophen  (PERCOCET/ROXICET) 5-325 MG tablet Take 1 tablet by mouth every 6 (six) hours as needed for severe pain. 09/14/22   Bernardino Ditch, NP  predniSONE  (STERAPRED UNI-PAK 21 TAB) 10 MG (21) TBPK tablet Take by mouth daily. Take 6 tabs by mouth daily for 1, then 5 tabs for 1 day, then 4  tabs for 1 day, then 3 tabs for 1 day, then 2 tabs for 1 day, then 1 tab for 1 day. 09/02/23   Brimage, Vondra, DO  terbinafine (LAMISIL) 250 MG tablet  05/02/18   [provider]  traZODone (DESYREL) 100 MG tablet Take by mouth. 10/07/22 11/06/22  [provider]  traZODone (DESYREL) 50 MG tablet  05/15/18   [provider]  triamcinolone  ointment (KENALOG ) 0.1 % Apply topically. 10/07/22 10/07/23  [provider]  Zinc Oxide 40 % PSTE Apply topically. 08/21/22   [provider]    Family History Family History  Problem Relation Age of Onset   Emphysema Mother    Prostate cancer Father     Social History Social History   Tobacco Use   Smoking  status: Never   Smokeless tobacco: Never  Vaping Use   Vaping status: Never Used  Substance Use Topics   Alcohol use: No   Drug use: Never     Allergies   Heparin, Vicodin [hydrocodone -acetaminophen ], and Hydrocodone -acetaminophen    Review of Systems Review of Systems  HENT:  Positive for facial swelling.   Eyes:  Negative for visual disturbance.  Gastrointestinal:  Negative for nausea and vomiting.  Neurological:  Negative for headaches.     Physical Exam Triage Vital Signs ED Triage Vitals  Encounter Vitals Group     BP 09/12/23 1827 118/63     Girls Systolic BP Percentile --      Girls Diastolic BP Percentile --      Boys Systolic BP Percentile --      Boys Diastolic BP Percentile --      Pulse Rate 09/12/23 1827 83     Resp 09/12/23 1827 16     Temp 09/12/23 1827 98.1 F (36.7 C)     Temp Source 09/12/23 1827 Oral     SpO2 09/12/23 1827 97 %     Weight 09/12/23 1826 169 lb 15.6 oz (77.1 kg)     Height 09/12/23 1826 6' 1 (1.854 m)     Head Circumference --      Peak Flow --      Pain Score 09/12/23 1831 8     Pain Loc --      Pain Education --      Exclude from Growth Chart --    No data found.  Updated Vital Signs BP 118/63 (BP Location: Left Arm)   Pulse 83   Temp 98.1 F (36.7 C) (Oral)   Resp 16   Ht 6' 1 (1.854 m)   Wt 169 lb 15.6 oz (77.1 kg)   SpO2 97%   BMI 22.43 kg/m   Visual Acuity Right Eye Distance:   Left Eye Distance:   Bilateral Distance:    Right Eye Near:   Left Eye Near:    Bilateral Near:     Physical Exam Vitals and nursing note reviewed.  Constitutional:      Appearance: Normal appearance. He is not ill-appearing.  HENT:     Head: Normocephalic and atraumatic.  Skin:    General: Skin is warm and dry.     Capillary Refill: Capillary refill takes less than 2 seconds.     Findings: No bruising, erythema or rash.  Neurological:     General: No focal deficit present.     Mental Status: He is alert and oriented to  person, place, and time.      UC Treatments / Results  Labs (all labs ordered are  listed, but only abnormal results are displayed) Labs Reviewed - No data to display  EKG   Radiology No results found.  Procedures Procedures (including critical care time)  Medications Ordered in UC Medications - No data to display  Initial Impression / Assessment and Plan / UC Course  I have reviewed the triage vital signs and the nursing notes.  Pertinent labs & imaging results that were available during my care of the patient were reviewed by me and considered in my medical decision making (see chart for details).   Patient is a pleasant, nontoxic-appearing 73 year old male presenting for evaluation of left-sided jaw pain after suffering a ground-level fall last night.  He reports that he tripped over his dog, and impacted the table with the left side of his jaw, and then hit the floor.  He did not have a loss of consciousness and he denies any changes in vision, nausea or vomiting, or headache.  In the exam room his mandible appears to be shifted to the right and there is a marked area of tenderness and palpable defect in the ramus of the left mandible.  She has minimal range of motion secondary to pain.  I am concerned the patient has broken his jaw and have advised him that he needs to be evaluated in the emergency department where he can have either a Panorex performed or a CT of his maxillofacial facial bones.  I have recommended that he go to [facility] given that they have a dental school there and they have OMFS and dental on-call.   Final Clinical Impressions(s) / UC Diagnoses   Final diagnoses:  Jaw pain     Discharge Instructions      As we discussed, I am concerned that you may have broken your jaw.  Please go to [facility] ER to be evaluated by the dentist or maxillofacial surgeon on-call.  Nothing to eat or drink until after you have been evaluated in the  ER.     ED Prescriptions   None    PDMP not reviewed this encounter.   Bernardino Ditch, NP 09/12/23 [phone]

## 2023-09-15 NOTE — Congregational Nurse Program (Signed)
  Dept: [phone]   Congregational Nurse Program Note  Date of Encounter: 09/15/2023  Past Medical History: Past Medical History:  Diagnosis Date   Diabetes mellitus without complication [facility])     Encounter Details:  Community Questionnaire - 09/15/23 1458       Questionnaire   Ask client: Do you give verbal consent for me to treat you today? Yes    Student Assistance N/A    [facility] S[facility]  [facility]    Encounter Setting CN site    Population Status Unknown    Insurance Medicare    Insurance/Financial Assistance Referral N/A    Medication N/A    Medical Provider Yes    Screening Referrals Made N/A    Medical Referrals Made N/A    Medical Appointment Completed N/A    CNP Interventions Advocate/Support    Screenings CN Performed N/A    ED Visit Averted N/A    Life-Saving Intervention Made N/A          Patient has recently broken jaw and has not been able to find phone number for dentist that will help him. Nurse provided resources for dental providers that accept medicare and provided phone number for [facility] dental school.

## 2024-03-15 LAB — GLUCOSE, POCT (MANUAL RESULT ENTRY): POC Glucose: 165 mg/dL — AB (ref 70–99)

## 2024-03-15 NOTE — Congregational Nurse Program (Signed)
" °  Dept: [phone]   Congregational Nurse Program Note  Date of Encounter: 03/15/2024  Patient mentioned that he is having a great deal of pain in legs and feet due to his neuropathy.  I suggested that he reach out to his doctor or ask doctor for referral for diabetic footwear to help with issues.  I provided him with [facility] as a resource for his doctor to refer him to for fitting of diabetic shoes.    Also provided patient with incontinence pull ups per his request. Past Medical History: Past Medical History:  Diagnosis Date   Diabetes mellitus without complication [facility])     Encounter Details:  Community Questionnaire - 03/15/24 1019       Questionnaire   Ask client: Do you give verbal consent for me to treat you today? Yes    Student Assistance N/A    [facility] S[facility]  [facility]    Encounter Setting CN site    Population Status Unknown    Insurance Medicare    Insurance/Financial Assistance Referral N/A    Medication N/A    Medical Provider Yes    Medical Referrals Made NA   [facility]   Medical Appointment Completed N/A    Screenings CN Performed (remember to also record results) Blood Glucose    CNP Interventions Advocate/Support    ED Visit Averted N/A            "
